# Patient Record
Sex: Male | Born: 1950 | Race: White | Hispanic: No | Smoking: Former smoker
Health system: Southern US, Community
[De-identification: ages and names within clinical notes are randomized; demographics above are authoritative.]

## PROBLEM LIST (undated history)

## (undated) DIAGNOSIS — E785 Hyperlipidemia, unspecified: Secondary | ICD-10-CM

## (undated) DIAGNOSIS — I82409 Acute embolism and thrombosis of unspecified deep veins of unspecified lower extremity: Secondary | ICD-10-CM

## (undated) DIAGNOSIS — I10 Essential (primary) hypertension: Secondary | ICD-10-CM

## (undated) DIAGNOSIS — L0390 Cellulitis, unspecified: Secondary | ICD-10-CM

## (undated) DIAGNOSIS — E669 Obesity, unspecified: Secondary | ICD-10-CM

## (undated) DIAGNOSIS — I4891 Unspecified atrial fibrillation: Secondary | ICD-10-CM

## (undated) HISTORY — PX: ABDOMINAL SURGERY: SHX537

## (undated) HISTORY — DX: Hyperlipidemia, unspecified: E78.5

## (undated) HISTORY — PX: PANNICULECTOMY: SUR1001

## (undated) HISTORY — DX: Acute embolism and thrombosis of unspecified deep veins of unspecified lower extremity: I82.409

---

## 2002-08-08 ENCOUNTER — Emergency Department (HOSPITAL_COMMUNITY): Admission: EM | Admit: 2002-08-08 | Discharge: 2002-08-09 | Payer: Self-pay | Admitting: Emergency Medicine

## 2005-07-26 ENCOUNTER — Inpatient Hospital Stay (HOSPITAL_COMMUNITY): Admission: EM | Admit: 2005-07-26 | Discharge: 2005-08-03 | Payer: Self-pay | Admitting: Emergency Medicine

## 2005-07-28 ENCOUNTER — Ambulatory Visit: Payer: Self-pay | Admitting: *Deleted

## 2005-08-05 ENCOUNTER — Inpatient Hospital Stay (HOSPITAL_COMMUNITY): Admission: EM | Admit: 2005-08-05 | Discharge: 2005-08-07 | Payer: Self-pay | Admitting: Emergency Medicine

## 2005-08-05 ENCOUNTER — Ambulatory Visit: Payer: Self-pay | Admitting: Internal Medicine

## 2005-08-12 ENCOUNTER — Emergency Department (HOSPITAL_COMMUNITY): Admission: EM | Admit: 2005-08-12 | Discharge: 2005-08-13 | Payer: Self-pay | Admitting: Emergency Medicine

## 2006-12-09 ENCOUNTER — Ambulatory Visit: Payer: Self-pay | Admitting: Infectious Disease

## 2006-12-09 ENCOUNTER — Ambulatory Visit: Payer: Self-pay | Admitting: Internal Medicine

## 2006-12-09 ENCOUNTER — Inpatient Hospital Stay (HOSPITAL_COMMUNITY): Admission: EM | Admit: 2006-12-09 | Discharge: 2006-12-14 | Payer: Self-pay | Admitting: Emergency Medicine

## 2006-12-15 ENCOUNTER — Other Ambulatory Visit: Payer: Self-pay | Admitting: Emergency Medicine

## 2006-12-15 ENCOUNTER — Ambulatory Visit: Payer: Self-pay | Admitting: Cardiology

## 2006-12-15 ENCOUNTER — Inpatient Hospital Stay (HOSPITAL_COMMUNITY): Admission: AD | Admit: 2006-12-15 | Discharge: 2006-12-18 | Payer: Self-pay | Admitting: Cardiology

## 2006-12-18 ENCOUNTER — Encounter: Payer: Self-pay | Admitting: Cardiology

## 2007-02-14 ENCOUNTER — Observation Stay (HOSPITAL_COMMUNITY): Admission: EM | Admit: 2007-02-14 | Discharge: 2007-02-15 | Payer: Self-pay | Admitting: Emergency Medicine

## 2007-03-26 IMAGING — CR DG CHEST 1V PORT
1 series · 1 of 1 positions shown · non-contrast
Comparison: none

CLINICAL DATA: Scrotum cellulitis.  PICC line placement.
 PORTABLE CHEST - 1 VIEW - 08/06/05 AT 9394 HOURS:

[view not recorded]
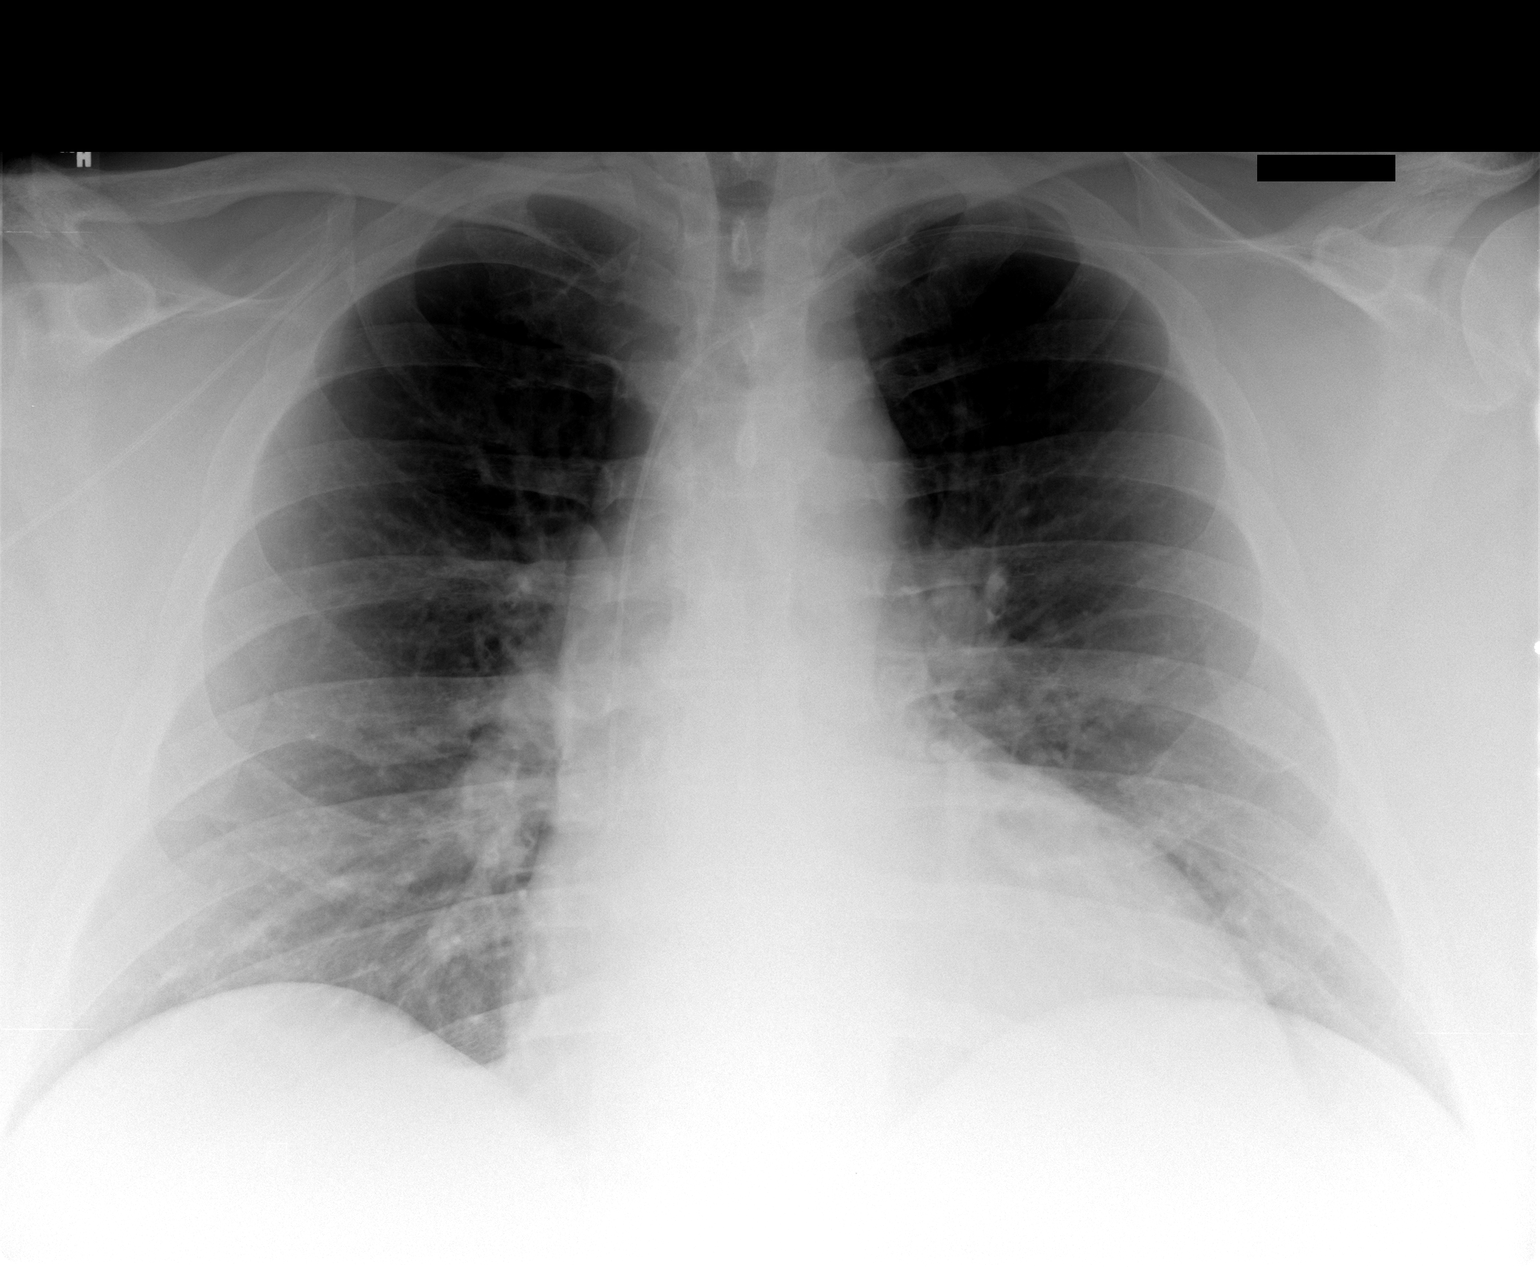

[1 of 1 positions shown; findings below may reference images not displayed]

FINDINGS: A PICC line has been inserted via a left upper extremity approach.  The tip of the catheter appears to be near the SVC-right atrial junction.  The lungs are well expanded and clear of an active process.  The heart appears mildly enlarged.
IMPRESSION: The PICC line tip is near the SVC-right atrial junction.  See comments above.

## 2008-03-20 ENCOUNTER — Encounter: Admission: RE | Admit: 2008-03-20 | Discharge: 2008-03-20 | Payer: Self-pay | Admitting: Family Medicine

## 2008-03-22 ENCOUNTER — Encounter: Admission: RE | Admit: 2008-03-22 | Discharge: 2008-03-22 | Payer: Self-pay | Admitting: Family Medicine

## 2009-03-02 ENCOUNTER — Ambulatory Visit (HOSPITAL_COMMUNITY): Admission: RE | Admit: 2009-03-02 | Discharge: 2009-03-02 | Payer: Self-pay | Admitting: Family Medicine

## 2010-05-17 NOTE — H&P (Signed)
NAMEEUSEVIO, SCHRIVER NO.:  [id number]   MEDICAL RECORD NO.:  [id number]          PATIENT TYPE:  INP   LOCATION:  2021                         FACILITY:  [facility]   PHYSICIAN:  Rollene Rotunda, MD, FACCDATE OF BIRTH:  03/16/1950   DATE OF ADMISSION:  12/15/2006  DATE OF DISCHARGE:                              HISTORY & PHYSICAL   CARDIOLOGIST:  He had previously been seen by Dr. Dorethea Clan.   PRIMARY CARE PHYSICIAN:  [name] of the teaching service.   CHIEF COMPLAINT:  Weakness and lethargy.   HISTORY OF PRESENT ILLNESS:  Mr. Hacker is a 60 year old male patient  who has a history of atrial fibrillation evaluated by Dr. Dorethea Clan some  years ago at [facility].  The patient had felt to be in  permanent atrial fibrillation at that time.  He was not felt to be a  great candidate for Coumadin secondary to a history of polysubstance  abuse.  We have seen him since that time.  He was admitted to [facility] recently with pannus cellulitis, probable septum sepsis and  acute renal failure that was mild.  He was treated with antibiotics and  his rate was controlled with medication.  He was admitted from December  7 through December 12 and discharged yesterday.  There is some report  that he left against medical advice but this is not clear from the  chart. He was to follow-up with [name] of the teaching service. The  plan was to obtain a surgical consult in the future for panniculectomy.   The patient apparently returned home yesterday and fell lethargic today  and apparently fell.  There was no reported injury.  He was seen by the  ER physician at [facility].  He was noted to be in atrial  fibrillation with rapid ventricular rate. The hospitalist service there  would not  admit him secondary to no cardiology coverage over the  weekend.  Therefore he was transferred to our service at [facility] for further evaluation and treat.   The patient denies any chest pain or significant shortness of breath.  He denies orthopnea or PND.  He says he is usually quite active and can  ambulate around the house, go up and down steps and take care of  himself.  He again denies any chest pain. He denies any syncope or near  syncope.  He denies any significant cough.   PAST MEDICAL HISTORY:  1. Permanent atrial fibrillation.      a.     No history of Coumadin therapy - he was not felt to be a       good Coumadin candidate secondary to substance abuse.  2. Hypertension.  3. Hyperlipidemia -?  4. Morbid obesity.  5. History of right knee and left knee arthroscopy.  6. History of scrotal cellulitis.   MEDICATIONS AT HOME:  According to records were to include:  1. Bactrim DS 2 tablets 3 times a day for 15 days.  2. Prevacid 30 mg daily.  3. Aspirin 81 mg daily.  4.  MiraLax.  5. Calcium carbonate plus vitamin D 3 times a day.  6. Metoprolol 100 mg b.i.d.  The patient says that he was not taking any medications at home.   ALLERGIES:  VANCOMYCIN causes a rash.   SOCIAL HISTORY:  The patient lives in [place], he is unmarried, he  has two children.  He still smokes cigarettes at a 60 pack-year history.  He drinks a six-pack of beer a week and he admits to cocaine abuse about  2 weeks ago.   FAMILY HISTORY:  Insignificant for coronary artery disease.   REVIEW OF SYSTEMS:  Please see HPI.  Denies any fevers, chills, sore  throats, melena, hematochezia, hematuria, dysuria.  He does admit to  some dysphagia but no odynophagia.  Denies any monocular blindness,  unilateral weakness, difficulty of speech or facial droop.  The rest of  the review of systems are negative.   PHYSICAL EXAM:  He is a well-nourished, well-developed, obese male in no  acute distress. He falls asleep several times during the interview.  Temperature at [facility] was 100.1, it is now 98.3, pulse 108,  respirations 20, blood pressure 136/114. Oxygen  saturation 90%  on room  air.  HEENT:  Normal.  NECK:  Without appreciable JVD although I cannot fully assess this  secondary to neck girth and beard growth.  ENDOCRINE:  Without thyromegaly.  LYMPH:  Without lymphadenopathy.  CARDIAC:  Normal S1, S2. Irregularly regular rhythm, no appreciable  murmurs.  LUNGS:  With no appreciable rales, expiratory wheezes throughout.  SKIN:  With diffuse erythema and fibrosis over his pannus.  ABDOMEN:  Nontender.  Normoactive bowel sounds.  No rebound, no  guarding.  No organomegaly.  EXTREMITIES:  With 1 to 2+ edema bilaterally.  VASCULAR:  Dorsalis pedis and posterior tibialis pulses 2+ bilaterally.  MUSCULOSKELETAL:  No joint deformity.  NEUROLOGIC:  He is alert and oriented x3.  Cranial nerves II-XII grossly  intact.   EKG reveals atrial fibrillation with a heart rate of 137, normal axis,  no ischemic changes. Poor R wave progression.   LABORATORY DATA:  Hemoglobin 12.4, hematocrit 36.5, platelet count  485,000. White count 14,200, sodium 136, potassium 3.9, BUN 13,  creatinine 0.92, glucose 146, calcium 7.5.  His previous total protein  and albumin has been significantly depressed.   IMPRESSION:  1. Atrial fibrillation with rapid ventricular rate.  2. Morbid obesity.  3. Recent history of panus cellulitis with probable sepsis.  4. Lethargy.  5. Hypertension.  6. Tobacco abuse.  7. Polysubstance abuse.  8. History of sacral decubitus.   PLAN:  The patient is also to be examined by Dr. Antoine Poche.  It is  doubtful all of his symptoms are related to atrial fibrillation.  It  sounded as though his atrial fibrillation was fairly well-controlled  prior to discharge although on review of his records his rate was still  in the 100s to 115 range at the time of discharge on metoprolol.  He is  on a diltiazem drip now and his rate is better. We would not recommend  Lopressor at this time given his history of cocaine abuse. We will check  a  urine drug screen.  In the past, he has not been felt to be a good  candidate for Coumadin secondary to polysubstance abuse.  His aspirin  will be continued and he will be covered with Lovenox while in the  hospital.  His Bactrim will be continued for now.  We can certainly  consider digoxin if his rate becomes difficult to control with the  diltiazem.  It is uncertain why he is lethargic.  We will check an ABG  on room air to rule out CO2 narcosis.  Will also has wound care to see  the patient for care of his pannus and sacral decubitus.  Will also ask  social work to become involved to help with care post discharge as well  as with medications, etc.  We will also ask the teaching service to  become involved again and assume his care while he is here.  We will  follow along to help with control of his rate and other cardiac issues.  An echocardiogram we will be checked.      Tereso Newcomer, PA-C      Rollene Rotunda, MD, [facility]  Electronically Signed    SW/MEDQ  D:  12/15/2006  T:  12/17/2006  Job:  [id number]   cc:   [name]

## 2010-05-17 NOTE — H&P (Signed)
NAMEPAGE, LANCON NO.:  [id number]   MEDICAL RECORD NO.:  [id number]          PATIENT TYPE:  OBV   LOCATION:  A313                          FACILITY:  [facility]   PHYSICIAN:  Dalia Heading, M.D.  DATE OF BIRTH:  July 22, 1950   DATE OF ADMISSION:  02/13/2007  DATE OF DISCHARGE:  LH                              HISTORY & PHYSICAL   CHIEF COMPLAINT:  Abscess, pannus.   HISTORY OF PRESENT ILLNESS:  The patient is a 60 year old morbidly obese  white male who presents with a bleeding wound on the left side of his  massive pannus.  He started having a bleeding wound and presented to the  emergency room for evaluation and treatment.  I was called as the  bleeding seemed to continue.  On evaluation, the patient had old blood  in the cavity.  This was fully evacuated along with clots.  This seemed  to quell be bleeding.  Due to the patient's history of MRSA as well as  his morbid obesity, the patient is being brought into the hospital for  wound care.   PAST MEDICAL HISTORY:  1. Morbid obesity, 460 pounds.  2. Hypertension.   PAST SURGICAL HISTORY:  Unremarkable.   CURRENT MEDICATIONS:  1. Benicar 20 mg p.o. daily.  2. Aspirin 1 tablet daily.   ALLERGIES:  No known drug allergies.   REVIEW OF SYSTEMS:  The patient denies any other cardiopulmonary  difficulties.  He is being evaluated at [facility] for possible  panniculectomy in the future.   PHYSICAL EXAMINATION:  The patient is a morbidly obese white male in no  acute distress.  LUNGS:  Clear to auscultation with equal breath sounds bilaterally.  HEART:  A regular rate and rhythm without S3, S4, or murmurs.  The  patient has a large abdomen with a lymphedematous pannus in the left  lower aspect.  A previously-evacuated abscess/hematoma cavity is not  actively bleeding at this time.  A pressure dressing has been applied.   White blood cell count 9.2, hematocrit 32, platelet count 315.  MET-7 is  within normal  limits.   IMPRESSION:  1. Cellulitis with old abscess cavity and hematoma, pannus.  2. Morbid obesity.  3. Hypertension.   PLAN:  The patient will be admitted to the hospital for wound  observation.  A pressure dressing as well as a weighted bag will be  applied to the region.  He will be started on vancomycin given his  history of MRSA.      Dalia Heading, M.D.  Electronically Signed     MAJ/MEDQ  D:  02/14/2007  T:  02/14/2007  Job:  [id number]   cc:   [facility]

## 2010-05-17 NOTE — Consult Note (Signed)
[name], Mathews NO.:  [id number]   MEDICAL RECORD NO.:  [id number]          PATIENT TYPE:  INP   LOCATION:  2021                         FACILITY:  [facility]   PHYSICIAN:  Velora Heckler, MD      DATE OF BIRTH:  03-Oct-1950   DATE OF CONSULTATION:  12/17/2006  DATE OF DISCHARGE:                                 CONSULTATION   REASON FOR CONSULTATION:  Recurrent pannus cellulitis.   HISTORY OF PRESENT ILLNESS:  [name] is a 56-year male patient,  morbidly obese, also history of atrial fibrillation on Coumadin, sleep  apnea, hypertension and prior of scrotal cellulitis requiring operative  I&D in July 2007.  He was recently admitted to [facility] from  December 7, to December 12, because of pannus cellulitis.  He was  treated with antibiotic therapy/Bactrim and was discharged home on  Bactrim therapy.  He returned on December 13, complaining of lethargy,  weakness and anorexia.  According to the documentation, the patient was  not taking any of his prescribed medications including his Bactrim.  He  presented with complaints of weakness as noted as well as increasing  pain in the pannus region.  His initial white count was 14,200.  Since  admission, the patient had been placed on doxycycline, he is allergic to  vancomycin and he has subsequently been changed over to Bactrim therapy  today.  His cultures from the wound drainage were positive for  Streptococcus agalactia, Group B pansensitive.  Internal medicine  requested surgical evaluation for panniculectomy.   REVIEW OF SYSTEMS:  As above.  The patient states his pannus is usually  somewhat ruddy discolored with chronic pitting edema and dry skin.  He  states the pain is actually markedly improved as prior to admission.   PAST MEDICAL HISTORY:  1. Atrial fibrillation, not on Coumadin.  2. Hypertension.  3. Dyslipidemia.  4. Morbid obesity.  5. Ongoing tobacco abuse.  6. OSA/possible COPD.   PAST SURGICAL HISTORY:  1. Scrotal cellulitis in July 2007, requiring intraoperative I&D by      Dr. Dennie Maizes.  2. History of bilateral knee arthroscopy in the past.   SOCIAL HISTORY:  The patient lives in [place] and he is married with  two children.  He continues to smoke 60 pack years.  He drinks a six-  pack every week.  He is disabled.  He used cocaine 2 weeks ago.  Urine  drug screen is positive for cocaine.  Positive for opiates, but the  urine drug screen was obtained after the patient had been in the ER and  had received narcotic pain medications.   ALLERGIES:  VANCOMYCIN which causes a rash.   CURRENT MEDICATIONS:  1. Bactrim.  2. Calcium.  3. Lovenox for DVT prophylaxis.  4. Full-dose aspirin.  5. Lopressor.  6. Protonix.  7. Diltiazem.  8. Thiamine.  9. Folic acid.  10.MiraLax p.r.n.  11.Morphine p.r.n. for pain.   PHYSICAL EXAMINATION:  GENERAL:  A drowsy, morbidly obese, pale patient  with only minimal complaints of pain.  VITAL SIGNS:  Temperature 97.8,  BP 144/91, pulse 108 and slightly  irregular, respirations 20.  NEUROLOGIC:  The patient awakens.  He is moving all extremities x4.  He  is somewhat drowsy and speech is slightly slurred, but movement and  otherwise physical exam is symmetrical.  HEENT:  Head is normocephalic.  Sclera noninjected.  NECK:  Supple and obese.  CHEST:  Bilateral lung sounds are clear to auscultation.  Respiratory  effort is unlabored.  The patient is supine.  CARDIAC:  Pulse is slightly irregular, S1, S2.  Telemetry demonstrates  atrial fibrillation with occasional aberrancy.  ABDOMEN:  Obese, very large.  He has a very large dependent pannus with  1+ pitting edema over the most distal portion.  There is a ruddy  discoloration to the skin of the distal pannus, that is not warm to  touch.  He has grape-like clusters of the skin involving the distal  pannus.  These are not vesicles, they are soft tissue/skin lesions.   The  remainder of his abdomen scan is normal in appearance, soft, bowel  sounds are present.  He has a large umbilical defect about 3 cm in  diameter without evidence of a herniation.  The patient was examined  supine.  EXTREMITIES:  Dependent edema of the legs with evidence of prior edema  as evidenced with hemosiderin changes to the skin.   LABORATORY DATA AND X-RAY FINDINGS:  White count 9600, hemoglobin 12,  platelets 447,000.  White count admission on 11,700.  Sodium 136,  potassium 3.7, CO2 23, glucose 117, BUN 8, creatinine 0.62.   Hip x-ray was done on admission because the patient apparently has  fallen at home.  No definite fracture.   IMPRESSION:  1. Pannus cellulitis, improving.  2. Medical noncompliance.  3. Other medical issues as previously mentioned.   RECOMMENDATIONS:  1. At this time no immediate indications for surgery.  He seems to be      improving quite well on oral antibiotics.  2. Would check an ultrasound of the pannus region to see if there is      an underlying abscess, doubt.  3. Recommend medical therapy.  The patient does have a pansensitive      Streptococcus agalactiae.  Suspect noncompliance is a contributing      factor to ongoing infection.  4. Consider ID consult regarding duration of treatment, especially      since this is involving a morbidly person with a pannus that may      have limited perfusion.  He may benefit from IV antibiotic      treatment for this and may need a PICC line, but I will defer this      to internal medicine/ID.  5. Regarding panniculectomy, would not do that in a patient that has      an acute infection.  A pannus in general, even when healthy, are      notorious for poor healing.  If an abscess were to be detected, we      would potentially drain from a percutaneous standpoint, if      possible, or drain with a smallest incision possible to help      promote better healing.  If extensive pannus surgery is  indicated      in the future over noninfected skin, would recommend plastics      consultation or referral.   TM[facility] - Patient previously seen for this problem at [facility].  No indication for acute surgical intervention  at present.  No  plastic surgery services available for this patient in [place].  Would treat as above and refer to [facility] for follow-up.      Allison L. Rennis Harding, N.P.      Velora Heckler, MD  Electronically Signed    ALE/MEDQ  D:  12/17/2006  T:  12/18/2006  Job:  [id number]

## 2010-05-17 NOTE — Discharge Summary (Signed)
[name], CLAIRE NO.:  [id number]   MEDICAL RECORD NO.:  [id number]          PATIENT TYPE:  INP   LOCATION:  2021                         FACILITY:  [facility]   PHYSICIAN:  Alvester Morin, M.D.  DATE OF BIRTH:  11-28-1950   DATE OF ADMISSION:  12/15/2006  DATE OF DISCHARGE:  12/18/2006                               DISCHARGE SUMMARY   DISCHARGE DIAGNOSES:  1. Chronic atrial fibrillation with rapid ventricular response.  2. Right hip pain.  3. Metabolic acidosis.  4. Hypertension.  5. Right hip pain.  6. Sacral skin tear.  7. Hypertension.   DISCHARGE MEDICATIONS:  1. Bactrim DS two tablets b.i.d. for 21 more days.  2. Prevacid 30 mg daily.  3. Aspirin 81 mg daily.  4. MiraLax p.r.n.  5. Metoprolol 100 mg p.o. b.i.d.  6. Diltiazem 240 mg p.o. daily.  7. Calcium carbonate with vitamin D 500 mg p.o. daily.   DISPOSITION:  Patient will follow up with his primary care physician as  an outpatient.  Patient will also follow up with [facility] for the  possibility of surgery for a pannectomy.   PROCEDURE PERFORMED:  None.   CONSULTATIONS:  None.   BRIEF ADMISSION HISTORY AND PHYSICAL:  This is a 60 year old male with  past medical history of morbid obesity, hypertension, atrial  fibrillation with RVR, just discharged from this hospital for pannus  cellulitis presents to the ED because of a history of falling.  Patient  returned home to live wit his mom the day before admission.  The patient  slipped and fell while moving from his bed to his living room.  He  denies any presyncope, any imbalance, vertigo, shortness of breath,  prior history of clotting.  He reports he did not fall, he just slipped.  He denied any focal weakness.  The patient reports that before his  previous admission, he had had some shortness of breath.  Patient  reported that his diarrhea which developed during the recent  hospitalization has resolved.  Patient reported pain under his  pannus on  the side of infection. Noted to have afib with RVR in ED and was  admitted for this.   PHYSICAL EXAMINATION:  VITAL SIGNS:  His temperature was 97.3, his blood  pressure was 108/18, respirations 18, pulse was 143.  GENERAL APPEARANCE:  He is a morbidly obese male lying in bed.  LUNGS:  Clear to auscultation without airway wheezing.  CARDIOVASCULAR:  Irregular with an S1 and S2.  ABDOMEN:  Positive bowel sounds.  Tender in the area of the pannus.  EXTREMITIES:  Lower extremity cold to touch but positive pulses with 2+  pitting edema.  SKIN:  Pannus with erythema and drainage, lichenification of distal part  of pannus; improved from previous admission.  NEUROLOGIC:  Nonfocal.   LABORATORY DATA:  Sodium 136, potassium 3.9, chloride 107, bicarb 18,  BUN 13, creatinine 0.9, glucose 146.  White blood cells 14.6, ANC 12.7,  hemoglobin 12.4, MCV 87.8, platelets 487.   HOSPITAL COURSE:  PROBLEM #1 -  CHRONIC ATRIAL FIBRILLATION WITH RAPID  VENTRICULAR RESPONSE:  Patient was discharged from the hospital on  metoprolol 100mg  bid.  However, he was not taking his medication.  He  was started him on IV Diltiazem and p.o. Lopressor and controlled his  heart rate.  We checked a UDS which was negative.  We  changed him to  p.o. Diltiazem and Lopressor and his heart rate is controlled right now.  His heart rate is 76 on Diltiazem 240 mg qd and metoprolol 100 b.i.d.   PROBLEM #2 -  PANNUS CELLULITIS WITH LEUKOCYTOSIS:  Patient reports the  pain is the same.  He did not take his Bactrim when he was at home.  This was restarted and his white count came down.  No changes were made  to antibiotic.  He should be taking his antibiotic for at least 21 more  days.  He will be followed up at [facility] for evaluation for a  pannectomy.   PROBLEM #3 -  METABOLIC ACIDOSIS:  His anion gap was 15 and bicarb 18 on  admission.  The etiology of this was not clear and it quickly resolved.  His lactic  acid was negative.  PROBLEM #4 -  SACRAL SKIN TEAR:  Wound care was consulted.  Patient is  currently on antibiotic for his pannus cellulitis.   PROBLEM #5 -  HYPERTENSION:  We restarted his Lopressor and Diltiazem  and that helped with his blood pressure control.  On the day of  discharge, his blood pressure was 123/59.   PROBLEM #6 -  RIGHT HIP PAIN:  The patient related that when he slipped,  he hit his right hip.  A chest x-ray of the hip was done and it showed  no fractures.   DISCHARGE LABORATORY DATA:  Sodium 141, potassium 3.7, chloride 109,  bicarb 25, glucose 118, BUN 8, creatinine 0.7, calcium 8.1.  White blood  cells 8.6, hemoglobin 11.1, platelets 245.   DISCHARGE VITAL SIGNS:  Temperature 97, pulse 76, blood pressure 123/59.  His oxygen saturation was 92% on room air.      Marinda Elk, M.D.  Electronically Signed      Alvester Morin, M.D.  Electronically Signed    AF/MEDQ  D:  12/18/2006  T:  12/18/2006  Job:  [id number]

## 2010-05-17 NOTE — Discharge Summary (Signed)
[name], [name] NO.:  [id number]   MEDICAL RECORD NO.:  [id number]          PATIENT TYPE:  INP   LOCATION:  4740                         FACILITY:  [facility]   PHYSICIAN:  Alvester Morin, M.D.  DATE OF BIRTH:  [birth year]   DATE OF ADMISSION:  12/09/2006  DATE OF DISCHARGE:  12/14/2006                               DISCHARGE SUMMARY   CHIEF COMPLAINT:  Pannus cellulitis.   DISCHARGE DIAGNOSES:  1. Cellulitis of the pannus.  2. Diarrhea, resolved.  3. Atrial fibrillation with rapid ventricular response.  4. Sacral tear.  5. Hypotension, resolved.  6. Hypocalcemia, resolved.  7. Metabolic acidosis, resolved.  8. Hyponatremia.  9. Hypokalemia.  10.Acute renal insufficiency.   DISCHARGE MEDICATIONS:  1. Bactrim double strength 2 tabs b.i.d. x15 days.  2. Prevacid 30 mg daily.  3. Aspirin 81 mg daily.  4. Miralax 72 g q.h.s. p.r.n. constipation.  5. Calcium carbonate with D 500 mg t.i.d. with milk.  6. Metoprolol 100 mg twice daily.   DISCHARGE FOLLOWUP:  The patient will follow up with [name], his  primary care doctor, phone number [phone], December 22, 11 a.m.  At  this time, [name] can make sure that the cellulitis is continuing  to improve, and the patient is continuing on his antibiotics as  prescribed.  The patient will also be seen by [facility] for  home health nursing.  We will call the patient to make sure that his  cellulitis continues to improve as well as check his sacral tear.  The  patient will be discharged home with a Foley catheter, and the patient  states that the Foley catheter is often changed by home health nursing.  This will need to be changed in 30 days.  He will have the insertion  date written on the back.  The other issue to address with [name]  as an outpatient is the need for a surgical panniculectomy.  The patient  was referred to [facility], [facility] for  panniculectomy in the  past; however, this was deferred for 1 year  secondary to a history of MRSA infection.  The patient now has had a  cellulitis of his pannus which was not infected by MRSA; therefore, as  soon as this infection clears, it is time to readdress the situation.  The patient is willing to proceed with this operation at this time.   STUDIES:  None.   CULTURES:  Blood culture, December 09, 2006, negative x2; urine culture,  December 09, 2006, negative x2; wound culture, December 09, 2006, abundant  group B strep x2.  No sensitivities were performed.  C. difficile,  December 13, 2006, negative x1.   CONSULTS:  None.   BRIEF HISTORY AND PHYSICAL:  Mr. [name] is a 60 year old morbidly obese  white male with a history of hypertension, atrial fibrillation, who  presents with a pannus cellulitis.  The patient reports that 6 days  prior to admission, he felt chills and burning into his belly.  He went  to his primary care [name] 3 days prior to  admission and was started  on antibiotics.  Before visiting his PCP, he reports that the burning  and warmth on his belly had gotten worse.  The patient has a history of  MRSA cellulitis of his testicles approximately 1 year ago.  The patient  was seen at [facility] for surgical excision of the  abdominal mass/pannus.  After his history of MRSA cellulitis, and they  chose to defer the surgery for approximately 1 year until he was MRSA  infection free.  The patient reports some objective fevers prior to  admission, no shortness of breath, no nausea or vomiting.  The patient  had diarrhea 1 time about 4 days prior to admission which resolved  spontaneously.  The patient's previous cellulitis was treated with  doxycycline and now to the point that he is able to ambulate and care  for himself at home, wash himself and  up and down stairs.  The patient  was started on doxycycline and was treated for appendix 2 days prior to  hospital admission by his  primary cae doctor, [name].  THE  PATIENT HAS ALLERGY TO VANCOMYCIN WHICH CAUSES A RASH.   MEDICAL HISTORY:  1. History of scrotal cellulitis status post I&D in 207.  2. Morbid obesity, approximately 550 pounds.  3. History of atrial fibrillation with RVR, uncontrolled.  4. Hypertension.  5. History of elevated PSA.   ADMISSION MEDICATIONS:  1. Benicar.  2. Doxycycline 100 mg b.i.d. started December 4.  3. Tylenol p.r.n..   The patient is a current smoker, 1/2 pack-per-day x3 years, quit 10  years ago and restarted 3 years ago.  Apart of that had smoked for 25  years.  The patient also acknowledges drinking about a 6-pack of beer 3-  4 times a week.  He occasionally sniffs cocaine which he snorts  approximately 2 times per month and denies any other IV drug use.  He is  divorced, lives with his mother, and some family history is  noncontributory.   REVIEW OF SYSTEMS:  Positive for fever, chills, night sweats, diarrhea,  anorexia, rash, itching, and chronic joint pain.   ADMISSION VITALS:  Temperature 100.1, blood pressure 93/46, pulse 141,  respiratory rate 32, saturating 97% on room air.  GENERAL:  The patient is in no acute distress, who appeared much better  than his vitals indicate.  Thus he was laid sideways in bed with his leg  over the rail watching football.  He had nonicteric pupils, small but reactive.  Extraocular movements  were intact.  ENT:  No erythema or exudate.  RESPIRATORY:  Clear anteriorly.  CARDIOVASCULAR:  Irregularly irregular but very distant.  GI:  Large pannus with a level of dark tint approximately 3-4 inches  below his navel.  This area is warm in the distal pannus.  There is  approximately 2+ edema on the tip of the pannus.  It is very firm, and  there was an oozing, clear/white puss.  There are deep bridges down at  the bottom of the pannus which appear almost to be like crypts.  Otherwise, above the area of infection, the belly is soft,  nontender, no  distention.  The tip of the pannus has no sensation.  GU:  Foley is in place.  SKIN:  See above with no LAD.  MUSCULOSKELETAL:  Normal strength and tone in both the upper and lower  extremities.  Cranial nerves 2-12 are intact, and sensation is intact except on the  pannus.  No other focal deficits.  PSYCH:  Appropriate.   LABS:  CBC shows a white count of 16.2, hemoglobin 13.4, platelets 288  with an ANC of 14.6, PT 15.1, INR 1.2, PTT 38.  Electrolytes:  Sodium  130,  potassium 3.3, chloride 98, bicarb 19, BUN 22, creatinine 1.47,  glucose 121.  The patient had a baseline creatinine of 1.1 on August 03, 2005.  EKG was done which showed atrial fibrillation.  Enzymes were  done.  Bilirubin 1.6, alk phos 59, AST 42, ALT 19, protein 6.1, albumin  2.0, calcium 8.2.   HOSPITAL COURSE BY PROBLEM:  1. Pannus cellulitis.  The patient presents with a rather large      infection of his distal pannus.  It is warm, red, edematous, oozing      a large amount of discharge.  The area of infection is      approximately 2 feet wide x 2 feet in diameter.  The patient has      history of scrotal cellulitis in October 2007, which was caused by      MRSA and initially it was thought that this was a recurrence.  The      patient was febrile, had a high white count, and hypotensive      systolic pressure in the 90s.  The patient was also tachycardic,      and we felt that he was possibly a set up for becoming septic.      Therefore, the patient was initially started on doxycycline and      Zosyn and placed on the ICU for close monitoring.  Multiple fluid      boluses were given to the patient to try and raise his blood      pressure, and he had a small response, maintaining his blood      pressure in the systolic in the 90s.  Wound cultures as well as      urine and blood cultures were also obtained, both of which were      negative.  The wound cultures grew back group B strep.  After       evaluation in the ICU x3 days and very aggressive hydration with IV      fluids with multiple 1 liter boluses of saline as well as a normal      saline rate of 200-300 mL/hr, the patient's blood pressure began to      improve.  It eventually settled down at around 100-120 systolic,      although his pulse remained in the low 110s-120s and in an atrial      fibrillation rhythm.  After cultures grew back, initially the gram-      negative coverage was dropped and although the patient was started      on IV doxycycline, after initially he was completely changed over      to IV Bactrim, as we felt that this worsening infection on p.o.      doxycycline as an outpatient constituted a failure.  This IV      doxycycline was then transitioned over to p.o. Bactrim, and the      patient was continued on Bactrim throughout his hospital course.      The patient completed 6 days of p.o. Bactrim and IV Bactrim while      in the hospital and was discharged home with 15 more days of      Bactrim for a total  __________ course of 21 days.  The patient's      white count improved over the course of his hospitalization from 16      on admission down to 10.8 on the day of discharge.  The patient      also remained afebrile with temperature of 99 at discharge.  2. Hyponatremia.  Initially, the patient presented with a sodium of      130, serum osm and urine osm were both tested.  His urine osm was      99.  We felt that this hyponatremia was likely hypovolemic      hypernatremia.  Therefore, we continued to aggressively hydrate the      patient, as the patient received IV hydration, and sodium began to      improve and eventually normalized at 137 on day of discharge.  3. Hypokalemia.  Initially on presentation, the patient's potassium      was 3.0, felt that this hypokalemia was related to renal __________      .  His mag level was checked, and it was found to be 2.2.      Therefore, we were unsure because of  his hypokalemia at this time,      he __________ .  His potassium was repleted orally and improved and      remained normalized throughout the rest of his hospital course.  4. Atrial fibrillation with rapid ventricular response.  The patient's      heart remained in atrial fibrillation, irregularly irregular      throughout his hospitalization.  The patient appears to be      chronically in this rhythm, and we chose to rate control him as      soon as his blood pressure would tolerate it.  We started the      patient initially on Lopressor, initially at 25 mg, and this was      titrated up to try and improve rate control.  The patient was      discharged home on Lopressor 100 mg b.i.d., and we felt this would      help maintain the patient's rate.  The patient was tachycardic up      to the 140s initially but on the day of discharge, tachycardia had      decreased to the 110s-120s.  5. Sacral tear.  The patient presented with a small sacral tear      approximately 4 inches from the top of his gluteal folds.  This was      seen by wound care and dressed.  They continued to pack this, and      the patient will have home health nursing come and check on the      sacral tears as an outpatient.  6. Hypotension:  This resolved without giving fluids and was felt to      be secondary to dehydration.  7. Hypocalcemia.  The patient's calcium  proceeded to climb during his      hospital course.  We were unsure of the cause but because of this      hypocalcemia, his mag level was normal.  PTH and vitamin D levels      were checked.  It is likely the patient would be vitamin D      deficient, and we started the patient on 500 mg of calcium plus      vitamin D t.i.d.  On the day of discharge, the patient's calcium  had increased to 7.4 which __________ corrected.  Based on his low      albumin, was 10.3.  8. Acute renal failure.  Initially, the patient's creatinine was      elevated at 1.5 but  decreased with aggressive IV hydration.  The      patient was prerenal, and creatinine normalized as the fluid      deficit decreased.  The patient's creatinine on day of discharge      was 0.7.  The patient had no history of chronic renal insufficiency      and no reason to believe that there was an underlying cause.   DISCHARGE LABS AND VITALS:  Temperature 97.6, blood pressure 117/64,  pulse 95, respiratory rate 20, saturating 97% on room air.  CBC:  White  count 10.8, hemoglobin 12.1, platelets 427.  Electrolytes:  Sodium 137,  potassium 3.8, chloride 110, bicarb 23, __________ 10, creatinine 0.83,  glucose 126.      Tacey Ruiz, MD  Electronically Signed      Alvester Morin, M.D.  Electronically Signed    JP/MEDQ  D:  12/14/2006  T:  12/15/2006  Job:  [id number]   cc:   Corrie Mckusick, M.D.

## 2010-05-20 NOTE — Group Therapy Note (Signed)
NAMEGARALD, RHEW              ACCOUNT NO.:  [id number]   MEDICAL RECORD NO.:  [id number]          PATIENT TYPE:  INP   LOCATION:  A220                          FACILITY:  [facility]   PHYSICIAN:  Margaretmary Dys, M.D.DATE OF BIRTH:  [birth year]   DATE OF PROCEDURE:  08/02/2005  DATE OF DISCHARGE:                                   PROGRESS NOTE   SUBJECTIVE:  Patient continues to do fairly well.  He feels a little more  comfortable.  He denies any fevers or chills, looking forward to going home.   PHYSICAL EXAMINATION:  His physical examination is unchanged.   LABORATORY DATA:  Unchanged.  The patient does have MRSA growing from the  scrotal cellulitis drainage.   ASSESSMENT AND PLAN:  1.  Scrotal cellulitis.  The patient has MRSA, remains on intravenous      vancomycin.  The patient has a PICC line already.  We will plan for home      IV antibiotics.  2.  Atrial fibrillation, rate controlled.  We will consider decreasing his      rate.  3.  Morbid obesity.  Patient needs referral for panniculectomy as an      outpatient.      Margaretmary Dys, M.D.  Electronically Signed     AM/MEDQ  D:  08/02/2005  T:  08/02/2005  Job:  [id number]

## 2010-05-20 NOTE — Op Note (Signed)
NAMEKRISTINE, TILEY              ACCOUNT NO.:  [id number]   MEDICAL RECORD NO.:  [id number]          PATIENT TYPE:  INP   LOCATION:  A220                          FACILITY:  [facility]   PHYSICIAN:  Dennie Maizes, M.D.   DATE OF BIRTH:  [birth year]   DATE OF PROCEDURE:  07/29/2005  DATE OF DISCHARGE:                                 OPERATIVE REPORT   PREOPERATIVE DIAGNOSIS:  Scrotal abscess.   POSTOPERATIVE DIAGNOSIS:  Scrotal abscess.   OPERATIVE PROCEDURE:  Incision and drainage of scrotal abscess.   ANESTHESIA:  Monitored anesthesia care.   SURGEON:  Dennie Maizes, M.D.   COMPLICATIONS:  None.   DRAINS:  None.  The wound was packed.   SPECIMEN:  Pus from scrotal abscess for aerobic and anaerobic culture   COMPLICATIONS:  None.   INDICATIONS FOR PROCEDURE:  This 60 year old male with a scrotal abscess was  taken to the operating room today for incision and drainage of the abscess.   DESCRIPTION OF PROCEDURE:  The patient was placed on the table in the supine  position.  IV sedation was given.  The genitalia were prepped and draped in  a sterile fashion.  There was an abscess in the base of the left  hemiscrotum.  There was drainage of pus from the abscess.  An incision about  6 cm in length was made over the most fluctuant part of the abscess.  About  50 cc of pus was drained.  Pus culture was done for aerobic and anaerobic  organisms.  Distal exploration of the abscess cavity was done, and all the  floccules were broken.  The bleeding vessels were cauterized and complete  hemostasis was  obtained.  The abscess cavity was then irrigated with dilute  Betadine solution.  The abscess cavity was then packed with gauze soaked in  dilute Betadine.  A dressing was applied.  The estimated blood loss was  minimal.  The patient was transferred to the PACU in a satisfactory  condition.      Dennie Maizes, M.D.  Electronically Signed     SK/MEDQ  D:  07/29/2005  T:   07/30/2005  Job:  [id number]

## 2010-05-20 NOTE — Group Therapy Note (Signed)
[name], [name]              ACCOUNT NO.:  [id number]   MEDICAL RECORD NO.:  [id number]          PATIENT TYPE:  INP   LOCATION:  A220                          FACILITY:  [facility]   PHYSICIAN:  Margaretmary Dys, M.D.DATE OF BIRTH:  08-19-1950   DATE OF PROCEDURE:  07/30/2005  DATE OF DISCHARGE:                                   PROGRESS NOTE   SUBJECTIVE:  The patient is seen now and is fairly drowsy when I saw him.  He said his pain was better.  He feels a little comfortable.  The patient is  status post I&D by Dr. Rito Ehrlich yesterday.  He denies any fevers or chills.   OBJECTIVE:  GENERAL:  Morbidly obese, not in acute distress.  The patient  was fairly drowsy, although easily arousable.  VITAL SIGNS:  Blood pressure was 137/82, pulse of 81, respirations 25,  temperature 97.7.  Oxygen saturation was 96% on room air.  HEENT:  Normocephalic and atraumatic.  Oral mucosa was moist with no  exudates.  NECK:  Supple.  No JVD, no lymphadenopathy.  LUNGS:  Reduced air entry bilaterally.  ABDOMEN:  Grossly obese.  GU:  Status post drainage of the left scrotal abscess.   LABORATORY/DIAGNOSTIC DATA:  White blood cell count has improved today to  7.7.  Hemoglobin is 14.1, hematocrit of 39.8, platelet count was 221 with no  left shift.  Sodium 134, potassium 3.7, chloride of 103, CO2 25, glucose  139.  Calcium was 7.5.  Wound cultures are growing from the 25th of July  MRSA.   ASSESSMENT/PLAN:  1.  Scrotal cellulitis:  The patient is status post incision and drainage by      Dr. Rito Ehrlich.  Continue current intravenous antibiotic therapy.  The      patient has methicillin-resistant Staphylococcus aureus in his wounds.  2.  Atrial fibrillation:  Rate is controlled.  The patient is not an      anticoagulation candidate.  We will continue his Cardizem which appears      to be controlling his weight very well.  3.  Large  panniculus in the lower abdomen:  The patient will need to refer  to a specialist for further evaluation.  Overall the patient is stable,      although really drowsy.  I will return to see him during the day.      Margaretmary Dys, M.D.  Electronically Signed     AM/MEDQ  D:  07/30/2005  T:  07/30/2005  Job:  [id number]

## 2010-05-20 NOTE — Consult Note (Signed)
[name], Randall NO.:  Mathews   MEDICAL RECORD NO.:  [id number]          PATIENT TYPE:  INP   LOCATION:  A220                          FACILITY:  [facility]   PHYSICIAN:  Vida Roller, M.D.   DATE OF BIRTH:  1950/08/04   DATE OF CONSULTATION:  07/28/2005  DATE OF DISCHARGE:                                   CONSULTATION   PRIMARY CARE PHYSICIAN:  None.   REFERRING PHYSICIAN:  Dennie Maizes, M.D., [facility], [facility]   HISTORY OF PRESENT ILLNESS:  [name] is a 60 year old man who has a  history of morbid obesity, weighing over 500 pounds.  He was admitted on  July 26, 2005, for a scrotal abscess.  This was drained under the care of  Dr. Dennie Maizes at the urology service here.  He was found to be in  atrial fibrillation with rapid ventricular response on an EKG, and we were  asked to evaluate him.  He is asymptomatic.  He denies any chest pain or  shortness of breath.  He is relatively limited due to his massive size and  his scrotal abscess but does not report any PND or orthopnea.  His lower  extremities have always been quite large.  He does not recall any edema.  His pain is primarily in the scrotal region.   MEDICATIONS:  He does not take any medications at home.   ALLERGIES:  He is not allergic to any medications.   PAST MEDICAL HISTORY:  He does not have any significant past medical  history.   PAST SURGICAL HISTORY:  He has had surgical procedure on his legs in the  past for some knee problems but does not remember what they were.   SOCIAL HISTORY:  He lives in [place].  He does not work.  He smokes about  1/2 pack of cigarettes a day and has done that for the last 10 years  although he says that he quit smoking for a long period of time.  He drinks  as much as a case of beer a day although typically only 2-3 beers a day.  The last drink was on Sunday.  He uses cocaine on a regular basis.  The last  use was about 3 weeks  ago according to him.   FAMILY HISTORY:  His family history is significant for blood clots in  multiple family members.  He denies any other medical problems.   REVIEW OF SYSTEMS:  His review of systems is generally negative.   PHYSICAL EXAMINATION:  GENERAL:  He is a morbidly obese man lying in bed  without any significant complaints.  VITAL SIGNS:  His pulse is 106.  He is  afebrile at 99.  His respirations are 20.  His blood pressure is 120/68, and  he is saturating 98% on room air.  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  NECK:  His neck is quite full.  I do not appreciate any jugular venous  distention or carotid  bruits.  CHEST:  His chest is quite distant but there is reasonable air movement.  CARDIOVASCULAR:  Exam is very distant.  No murmurs appreciated.  ABDOMEN:  Morbidly obese.  No obvious hepatosplenomegaly.  EXTREMITIES:  The lower extremities are without significant edema although  they are quite obese.  Pulses are 1+.   CURRENT MEDICATIONS:  1.  Cardizem CD 120 mg once a day.  2.  Lovenox as DVT prophylaxis dose.  3.  Gentamicin and vancomycin IV.  4.  He is on a nicotine patch.  5.  He is on Protonix 40 mg once a day.  6.  He is occasional p.r.n. and he has had one dose of Dilaudid for pain.   LABORATORY DATA:  His chest x-ray shows no acute disease.  He has had mild  cardiomegaly.  The CBC shows a white count of 11.9, H&H of 14 and 39,  platelet count 157,000.  Sodium 134, potassium 3.7, chloride 104,  bicarbonate 28, BUN 11, creatinine 0.8, blood sugar 125.  LFTs are normal.  TSH is 0.768 which is normal.  Urine drug screen is positive for cocaine and  his urinalysis is normal.   ASSESSMENT:  1.  Rapid fibrillation with rapid ventricular response in a patient who is      asymptomatic, completely unaware that he is in atrial fibrillation and      it is uncertain  how long this has occurred.  He is not having any chest      pain.  He is currently on p.o. diltiazem with a poorly controlled heart      rate.  2.  Morbid obesity in excess of 500 pounds.  3.  Scrotal abscess on IV antibiotics.  4.  Polysubstance abuse with urine drug screen positive for cocaine.  5.  Active tobacco abuse.   RECOMMENDATIONS:  1.  Rate control with diltiazem.  I would probably use a short acting p.o.      diltiazem here.  You can titrate it up very easily in this guy and get      his heart rate controlled.  I think if his heart rate is less than 90 he      is probably doing reasonably well.  I suspect his atrial fibrillation if      probably chronic given his multiple medical problems.  He probably has      pretty severe chronic obstructive pulmonary disease given his tobacco      abuse and his morbid obesity.  It is very likely that he also has      central sleep apnea.  2.  He is not a candidate for anticoagulation due to his polysubstance abuse      so I probably would put him on full dose aspirin.  3.  His morbid obesity precludes any further cardiac assessment because he      is too big to fit on any of the tables.  Echocardiogram, I think, would      be unproductive as it is very unlikely that we would be able to      adequately evaluate the heart given his obesity.  At this point I think      what I would do is just treat him symptomatically and move forward with      hopefully with some significant lifestyle modification.      Vida Roller, M.D.  Electronically Signed     JH/MEDQ  D:  07/28/2005  T:  07/28/2005  Job:  [id number]

## 2010-05-20 NOTE — Discharge Summary (Signed)
[name], [name] NO.:  [id number]   MEDICAL RECORD NO.:  [id number]          PATIENT TYPE:  INP   LOCATION:  5713                         FACILITY:  [facility]   PHYSICIAN:  [name], M.D.     DATE OF BIRTH:  [birth year]   DATE OF ADMISSION:  08/05/2005  DATE OF DISCHARGE:  08/07/2005                                 DISCHARGE SUMMARY   ATTENDING PHYSICIAN:  Dr. Darrick Mathews.   CHIEF COMPLAINT:  Change of a PICC line.   CONTINUED ED DOCTOR:   CONSULTANTS:  There are no consultants.   DISPOSITION:  1.Patient was told to follow up his urologist, Dr. Rito Mathews,  as previously instructed, in 1 week.   DISCHARGE DIAGNOSIS:  1. 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 Allergy to Vancomycin   PAST MEDICAL HISTORY:  1. Hypertension.  2. 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 History of paroxysmal Atrial fibrillation.  4. History of polysubstance abuse, cocaine positive last admission.   DISCHARGE MEDICATIONS:  1. Diltiazem 180 mg daily  2. Aspirin 81 mg daily.  3. Doxycycline 100 mg p.o. b.i.d. for 1 week.   PROCEDURES PERFORM:  1. PICC Line placement   H&P:  The patient was admitted for a change of a nonfunctioning PICC line  for continued administration of IV vancomycin.  Patient had recently been  admitted to [facility] to the service of Dr. Rito Mathews for treatment  of a scrotal abscess.  He had received an I & D and discharged home with IV  vancomycin approximately 1 week prior to current admission.  His prevoius  physician, Dr. Rito Mathews, Urology was unavailable for admission and ER  requested [facility] to admit patient for PICC line placment.   Labs:  WBC: 5.9, H&H: 15.4/44.2, PLT's: 260, NA: 135, K: 4.6, CL, 100, CO2: 30,  BUN: 9, Cr.: 1.1, Glucose 120  Wound culture from prior hospitalization notable for MRSA sensitive to  vancomycin, gentamycin, septra and doxycycline.   HOSPITAL COURSE:  1. Scrotal Abscess with  cellulitis, s/p I & D on last hospitalization,      with wound culture that grew MRSA sensitive to doxycycline.  patient      had already recieved 1 week of vancomycin.  Antibiotic were changed to      doxycycline given a newly discovered allergy to vancomycin.  he had      been told to follow up as originally planned with his urologist, Dr.      Rito Mathews, in 1 week. patient  was also instructed to bath 1-2 x weekly      with Hiba-Clens and to switch to an antibacterial soap ( Lever 2000)      for daily hygeine.  2. Allergy secondary to Vancomycin.  After admission, patient was noticed      to have a diffuse pruritic rash, which he reported as having started      during prioir hospitalization after his antiobiotics were switched ti      Vancomycin. His rash improved with discontinuation of vanc.  3. Morbid obesity  with severe pannus,  patient had been referred to [facility] for plastic Surgery evaluation for pannectomy and      was encourage to keep that apointment.  4. Hypertension; no changes were made to his medication regimen  5. history of paroxysmal atrial fibrillation; patient remained in sinus      rhythm on diltiazem.  he is not anticoagulated per previous physician      decision due to history of cocaine abuse.   DISCHARGE LABS:  His discharge labs are hemoglobin A1c of 5.9.  Blood  cultures positive for staph, MRSA, which is sensitive to doxycycline.  His  last BMET was Na:134, potassium 3.9, chloride 100, bicarb 30, glucose 132,  BUN 9, creatinine 1.1.  His CBC was, white blood cell 6.2, H&H 14.7, 41.0,  MCV 86.7, RDW 13.4 and platelets 233.   His vitals, temperature 96.  Pulse 62.  Respirations 18.  Blood pressure  155/55.  Saturating 96% on room air.      [name], M.D.  Electronically Signed      [name], M.D.  Electronically Signed    AF/MEDQ  D:  08/07/2005  T:  08/08/2005  Job:  [id number]

## 2010-05-20 NOTE — Consult Note (Signed)
[name], BLUNCK NO.:  [id number]   MEDICAL RECORD NO.:  [id number]          PATIENT TYPE:  INP   LOCATION:  A313                          FACILITY:  [facility]   PHYSICIAN:  Dennie Maizes, M.D.   DATE OF BIRTH:  06/05/1950   DATE OF CONSULTATION:  07/26/2005  DATE OF DISCHARGE:                                   CONSULTATION   REASON FOR CONSULTATION:  Scrotal pain and swelling.   CONSULTATION REPORT:  This 60 year old male has morbid obesity and he weighs  more than 500 pounds.  He has not seen a physician for the past ten years.  He has been doing well until recently.  He started having pain and swelling  of the left scrotal area on Monday.  The pain and swelling increased in  severity and he came to the emergency room for further evaluation.  There is  no history of injury to the scrotum.  He denied having voiding difficulty,  dysuria, fever, or chills.  There is no history of urinary tract infection,  chronic prostatitis, or hematuria in the past.  He has not had any kidney  stones.  The pain was very severe in intensity 10/10.   PAST MEDICAL HISTORY:  Morbid obesity.  He has undergone surgical procedures  on the right leg as well as the left knee.   MEDICATIONS:  Tylenol p.r.n.   ALLERGIES:  None.   PHYSICAL EXAMINATION:  The patient is moderately obese and weighs more than  500 pounds.  Abdomen soft, no palpable flank mass or CVA tenderness.  A  large panniculus is noted covering the scrotum and penis.  The penis is  buried in the prepubic fat.  There is erythema of the scrotal skin.  Right  testis is normal.  There is no tenderness.  There is a small area of  ulceration over left hemiscrotal skin.  There is thickening and edema of the  scrotal skin with cellulitis.  Left testis is normal in size.  Rectal  examination could not be done due to the patient's obesity.   ADMISSION LABS:  BUN 10, creatinine of 0.8.  Electrolytes within normal  range.   Glucose 167.  CBC with WBC 11.3, hemoglobin 14.6, hematocrit 41.6.   IMPRESSION:  Scrotal cellulitis, possible insect bite.   The patient has had wound culture and sensitivity, blood culture and  sensitivity, as well as urine culture and sensitivity.  We will await the  results. He is on IV Levaquin and responding well.  We will continue the  same treatment.  Thank you for this consult.  I plan to follow the patient  with you.      Dennie Maizes, M.D.  Electronically Signed     SK/MEDQ  D:  07/26/2005  T:  07/26/2005  Job:  [id number]   cc:   Osvaldo Shipper, MD

## 2010-05-20 NOTE — Discharge Summary (Signed)
NAMEHRISHIKESH, HOEG              ACCOUNT NO.:  [id number]   MEDICAL RECORD NO.:  [id number]          PATIENT TYPE:  INP   LOCATION:  A220                          FACILITY:  [facility]   PHYSICIAN:  Margaretmary Dys, M.D.DATE OF BIRTH:  Jan 18, 1950   DATE OF ADMISSION:  07/26/2005  DATE OF DISCHARGE:  08/02/2007LH                                 DISCHARGE SUMMARY   DISCHARGE DIAGNOSES:  1. Scrotal cellulitis.  2. Morbid obesity with patient weighing over 500 pounds.  3. Lower abdominal mass with pannus.  4. Atrial fibrillation with rapid ventricular response.   DISCHARGE MEDICATIONS:  1. Aspirin 81 mg once a day.  2. Cardizem 180 mg one tablet daily.  3. Bactrim one tablet p.o. b.i.d. for 14 days.  4. Vancomycin 2000 mg IV every 12 hours.  5. Oxycodone 5 mg one tablet every 4 hours as needed.   CONSULTATIONS:  1. Dennie Maizes, MD, Urology.  2. Farris Has. Dorethea Clan, MD, for Cardiology.  The patient was in atrial      fibrillation with rapid ventricular response.   SPECIAL PROCEDURES:  The patient had a debridement of his scrotum performed  as mentioned in the history above.   DIET:  Low fat diet.   ACTIVITY:  Patient advised to increase activity slowly.   FOLLOWUP:  1. The patient has a followup with [facility] for a panniculectomy in three to four weeks on      August 17, 2005, at 8:15 a.m.  2. Primary care physician, Dr. Felecia Shelling, on August 11, 2005, at 9 a.m.  3. Dr. Dennie Maizes, Urology, in two weeks.  4. Home Health is to arrange IV antibiotics.   HOSPITAL COURSE:  Mr. Zingg is a 60 year old Caucasian male, who has not  been to a physician in 8 to 10 years, with no significant medical problems  other than morbid obesity.  The patient weighs over 500 pounds.  Family  review, history and physical by Dr. Rito Ehrlich.  He was admitted to the  hospital with cellulitis of his scrotal wall.  He was stable with no  evidence of sepsis, and at that time he was admitted.   He did have cellulitis and was started empirically on Levaquin; however, his  cultures did grow MRSA for which he was switched to Vancomycin.  He did  fairly well with no significant leukocytosis or fever during the course of  hospitalization, and the patient was also seen by Urology for the  debridement of the scrotum.   The patient was also advised that he would need a panniculectomy for which  he has an appointment done.   Overall, patient did fairly well on the day of discharge with no concerns.   The patient will need a fair amount of weight loss in order to reduce his  risk for an adverse health outcome in the next few years.      Margaretmary Dys, M.D.  Electronically Signed     AM/MEDQ  D:  08/27/2005  T:  08/27/2005  Job:  [id number]

## 2010-05-20 NOTE — H&P (Signed)
NAMENESTOR, WIENEKE NO.:  [id number]   MEDICAL RECORD NO.:  [id number]          PATIENT TYPE:  INP   LOCATION:  A313                          FACILITY:  [facility]   PHYSICIAN:  Osvaldo Shipper, MD     DATE OF BIRTH:  May 04, 1950   DATE OF ADMISSION:  07/26/2005  DATE OF DISCHARGE:  LH                                HISTORY & PHYSICAL   The patient does not have a primary medical doctor.   ADMISSION DIAGNOSES:  1.  Scrotal cellulitis.  2.  Rule out orchitis.  3.  Morbid obesity with weight of about 500 pounds.  4.  Lower abdominal mass, likely pannus.   CHIEF COMPLAINT:  Pain in the testicular area.   HISTORY OF PRESENT ILLNESS:  The patient is a 60 year old Caucasian male who  has not been to a physician over the past 8 to 10 years, who really does not  have any medical problems except that he is morbid obesity with a weight  over 500 pounds. The patient mentioned that he was fine up until Monday  night when he started feeling pain in his testicular area, more so on the  left side than the right side. He noticed that the left scrotal area was  getting enlarged. Pain increased in severity over the following one day. The  next morning, he could not tolerate the pain any more and decided to come to  the ED. He had some chills at home yesterday but did not measure his  temperature. He has not noticed any discharge from that area. He denies any  dysuria. He denies any discharge from his penis. Pain was 10/10 in intensity  when he came in. He has been given pain shots with which his pain intensity  has come down to about 5/10.   The patient also mentions that he has a mass in his lower abdomen which was  detected by the ED physician. However, the patient mentions that he has had  this mass for over 7 to 8 years now and has been slowly increasing in size.  He states it impairs his ambulation; otherwise does not cause him any  discomfort. Denies any weight loss. He has  constipation on and off. When he  is very constipated, he has some blood in the stool, otherwise none. Denies  any melena.   MEDICATIONS AT HOME:  None. Tylenol p.r.n. over the counter.   ALLERGIES:  No known drug allergies.   PAST MEDICAL HISTORY:  Really unremarkable except for his obesity. There is  surgical history of some surgical procedure to the right leg as well as to  his left knee possibly.   SOCIAL HISTORY:  Lives in [place] with his mother and his son. He does  not work. He said he quit smoking for 10 years, and he restarted about a  year ago. He smokes a half pack of cigarettes a day. Drinks about 2 to 3  times a week about 3 to 4 beers; last drink was on Sunday. He mentions use  of cocaine with last use a few weeks  ago.   FAMILY HISTORY:  Significant for diabetes, arthritis, but no cancer in the  family.   REVIEW OF SYSTEMS:  Otherwise unremarkable. He says that he gets shortness  of breath with ambulation which is his usual baseline. Otherwise, he denies  any other complaints.   PHYSICAL EXAMINATION:  VITAL SIGNS:  Temperature 99.3, blood pressure  135/58, heart rate in the 70s, respiratory rate 18. Saturation 96% on room  air.  GENERAL:  This is a morbidly obese individual, pleasant to talk to, in no  distress.  HEENT:  There is no pallor. No icterus. Oral mucous membrane is moist. No  oral lesions are noted.  LUNGS:  Clear to auscultation bilaterally.  CARDIOVASCULAR:  S1 and S2 is normal, regular. No murmurs appreciated.  ABDOMEN:  Extremely obese, really cannot appreciate any mass, but it is  impossible to be sure. He does have a mass which drops off from the lower  aspect of his abdomen. It is not really pedunculated, but it is definitely  freely mobile. The skin over this is very hard, and the skin is extremely  indurated. No tenderness present. The lower part of this mass is definitely  warm to touch.  GENITOURINARY:  Erythema is present over both his  scrotum, but the left one  is much enlarged than the right. I can feel the right testicle which appears  to be normal in size, but I cannot appreciate the left testicle. The area is  exquisitely tender to palpation. There is one area about a 0.5-cm oval area  which had some yellowish exudate, but no active drainage is noted.   LABORATORY DATA:  White count 11.2, hemoglobin 14.6, neutrophils 82%,  platelet count 170, MCV 88. CMET shows a glucose of 167 and albumin of 3.0  with a total protein of 5.9, otherwise unremarkable.   He did have an ultrasound of the scrotum; the report is pending at this  time; preliminary report, did not show any abscess or evidence of torsion.  CT of the abdomen was attempted. However, the patient too big for the CT  table over here.   IMPRESSION:  This is a 60 year old Caucasian male who does not have any  medical problems except for morbid obesity who presents with pain in his  testicular area. He definitely has evidence for cellulitis. He could have  orchitis and epididymitis as well. No abscess seen on ultrasound per  preliminary report. He also has a lower abdominal mass which is quite huge  and almost looks like a pannus. I do not think that this is kind of tumor  from any infra-abdominal organ. A CT scan could not be done because of  patient's weight.   PLAN:  1.  Genitourinary. Will start the patient on Levaquin. He has been given      Unasyn. I think Levaquin should cover most organisms in this setting. I      will have the patient seen by Dr. Dennie Maizes of urology to see if he      has to add anything, especially since I cannot feel his left testes.      Pain control will be achieved with Dilaudid. Cultures will be sent from      the area that is visible. Blood cultures will also be sent, though he      has already received antibiotics.  2.  Abdominal mass. Most likely a pannus. I will have Dr. Lovell Sheehan take a     look  at this. Radiologist, Dr.  Jean Rosenthal, told me that they tried to      ultrasound this mass. However, the sonographic waves could not penetrate      enough to get a sense of what this mass could be. But this most likely      looks like a pannus. In any case, this has been ongoing for about 7 to 8      years now. If he needs imaging study, I think this can be done as an      inpatient.      Apparently [facility] does have capability to do CT's on large      patients. The patient may be referred to that center on discharge.   I anticipate patient staying here for 2 to 3 days. The patient is  unassigned. The unassigned physician is Dr. Felecia Shelling.      Osvaldo Shipper, MD  Electronically Signed     GK/MEDQ  D:  07/26/2005  T:  07/26/2005  Job:  [id number]   cc:   Dennie Maizes, M.D.  Fax: [phone]   Dalia Heading, M.D.  Fax: [phone]

## 2010-05-20 NOTE — Consult Note (Signed)
NAMEKARDER, GOODIN              ACCOUNT NO.:  [id number]   MEDICAL RECORD NO.:  [id number]          PATIENT TYPE:  INP   LOCATION:  A220                          FACILITY:  [facility]   PHYSICIAN:  Dennie Maizes, M.D.   DATE OF BIRTH:  11-06-1950   DATE OF CONSULTATION:  07/29/2005  DATE OF DISCHARGE:                                   CONSULTATION   CHIEF COMPLAINT:  Scrotal swelling and pain, scrotal cellulitis.   This is a 60 year old, morbidly, obese male who was admitted to hospital  with scrotal swelling and pain.  Clinical evaluation was suggestive of  scrotal cellulitis.  Appropriate cultures were done and the patient was  started on IV vancomycin and Levaquin.  As stated, the antibiotics were  changed to IV vancomycin and gentamycin.  The patient has increasing  swelling and pain over the scrotum today.  He also has mild drainage of pus  from the scrotal wound.  A Foley catheter has been inserted which is  draining clear urine.  The patient has been noted to have atrial  fibrillation.  He has been seen by the cardiologist and started on aspirin.  He is also on Lovenox for DVT prophylaxis.   PHYSICAL EXAMINATION:  ABDOMEN:  No change.  SCROTUM:  There is increase in the scrotal edema especially on the left and  the right side.  There is increase in the scrotal swelling on the left side.  There is fluctuation of the scrotal wall suggestive of scrotal abscess.  There is drainage of a small amount from the left hemiscrotum.  Testes are  normal.   IMPRESSION:  Scrotal cellulitis, possible scrotal abscess.   PLAN:  1.  Will keep the patient NPO.  2.  Incision and drainage of scrotal abscess under anesthesia.  The CRNA is      going to evaluate the patient to see if this can be done at [facility].  Will discuss with Dr. Osvaldo Shipper.      Dennie Maizes, M.D.  Electronically Signed     SK/MEDQ  D:  07/29/2005  T:  07/29/2005  Job:  [id number]

## 2010-05-20 NOTE — Group Therapy Note (Signed)
NAME:  [name], [name] NO.:  [id number]   MEDICAL RECORD NO.:  [id number]          PATIENT TYPE:  INP   LOCATION:  A220                          FACILITY:  [facility]   PHYSICIAN:  Osvaldo Shipper, MD     DATE OF BIRTH:  [birth year]   DATE OF PROCEDURE:  DATE OF DISCHARGE:                                   PROGRESS NOTE   SUBJECTIVE:  The patient is very concerned that his scrotal swelling on the  left side especially is increasing.  But his pain appears to be better.  He  says he is feeling more comfortable now.  Otherwise, he really does not have  any other concerns or complaints.   OBJECTIVE:  The patient is morbidly obese with a weight over 500 pounds.   VITALS:  He remains afebrile.  Temperature 98.4.  Heart rate in the 90s to  110.  Respiratory rate is about 18.  Blood pressure 118/62.  Saturation 97%  on room air.  HEART EXAM:  S1, S2 is irregularly-irregular.  Slightly tachycardic.  LUNGS:  Clear to auscultation anteriorly.  ABDOMEN:  Obese with a large pannus inferiorly.  GU EXAM:  His left scrotal size is definitely much more enlarged than  before.  There is one area of exudate noted on the left scrotum but there is  no active drainage.  His tenderness is also improved but is still  erythematous.  EXTREMITIES:  Without edema.   LAB DATA:  His white count is improved to 9.8 today.  Hemoglobin stable at  13.1.  Platelet count 175.  Sodium 134.  Glucose 142.  Gentamicin level 1.8.  His wound culture is now growing MRSA which is sensitive to Vancomycin and  Gentamicin, intermediate sensitive to Levaquin, and sensitive to Bactrim.  This is most likely community-acquired MRSA.   ASSESSMENT/PLAN:  1.  Scrotal cellulitis.  The patient is afebrile.  His symptoms are improved      but his swelling is remarkably increasing.  I wonder if there is some      focal abscess collection inside his scrotum.  This was not noted on an      ultrasound done at the time of  admission, but his swelling was not quite      as significant as it is today.  I am going to discuss this issue with      Dr. Dennie Maizes.  We might have to consider repeating imaging study      of his scrotum.  For now, continue Vancomycin and Gentamicin for the      MRSA that has been detected in the wound.  It appears the patient will      need to have IV Vancomycin to treat his infection.  I do not think he      can be switched over to p.o. Bactrim, and I think he is going to need a      PICC line.  This will be arranged for him on Monday.  2.  Atrial fibrillation, unclear as to the onset, likely secondary to his  infection.  Unfortunately, because of patient's weight, no cardiac      evaluation can be done.  Only an EKG could be done so far.  He is on      Cardizem which was changed over to a short-acting regimen yesterday.      Should adjust the dose to achieve better rate control.  His TSH level      was unremarkable.  Chest x-ray was done yesterday which shows      cardiomegaly with no failure and poorly-inflated lungs.  He is not a      candidate for anticoagulation and he has just been put on aspirin. Above      recommendations made by Dr. Dorethea Clan.  3.  Large pannus present in the lower abdomen, it is quite remarkable.  Dr.      Lovell Sheehan did see this and unfortunately said that there is nothing he can      do about it and he said the patient should be referred to plastic      surgery at [facility] and we are attempting to do so.  4.  DVT GI prophylaxis ongoing.  He is on nicotine patch for his tobacco      use.  He has been put on aspirin for his atrial fibrillation.      Osvaldo Shipper, MD  Electronically Signed     GK/MEDQ  D:  07/29/2005  T:  07/29/2005  Job:  [id number]   cc:   Dennie Maizes, M.D.  Fax: [phone]

## 2010-06-28 ENCOUNTER — Other Ambulatory Visit (HOSPITAL_COMMUNITY): Payer: Self-pay | Admitting: Family Medicine

## 2010-06-28 ENCOUNTER — Other Ambulatory Visit (HOSPITAL_COMMUNITY): Payer: Self-pay

## 2010-06-28 DIAGNOSIS — M7989 Other specified soft tissue disorders: Secondary | ICD-10-CM

## 2010-06-29 ENCOUNTER — Ambulatory Visit (HOSPITAL_COMMUNITY)
Admission: RE | Admit: 2010-06-29 | Discharge: 2010-06-29 | Disposition: A | Discharge status: Home / Self Care | Payer: Medicare Other | Source: Ambulatory Visit | Attending: Family Medicine | Admitting: Family Medicine

## 2010-06-29 ENCOUNTER — Inpatient Hospital Stay (HOSPITAL_COMMUNITY)
Admission: EM | Admit: 2010-06-29 | Discharge: 2010-07-02 | DRG: 254 | Disposition: A | Discharge status: Home / Self Care | Payer: Medicare Other | Source: Ambulatory Visit | Attending: Internal Medicine | Admitting: Internal Medicine

## 2010-06-29 ENCOUNTER — Emergency Department (HOSPITAL_COMMUNITY): Payer: Medicare Other

## 2010-06-29 DIAGNOSIS — I890 Lymphedema, not elsewhere classified: Secondary | ICD-10-CM | POA: Diagnosis present

## 2010-06-29 DIAGNOSIS — I82409 Acute embolism and thrombosis of unspecified deep veins of unspecified lower extremity: Secondary | ICD-10-CM

## 2010-06-29 DIAGNOSIS — Z7901 Long term (current) use of anticoagulants: Secondary | ICD-10-CM

## 2010-06-29 DIAGNOSIS — M7989 Other specified soft tissue disorders: Secondary | ICD-10-CM

## 2010-06-29 DIAGNOSIS — I824Y9 Acute embolism and thrombosis of unspecified deep veins of unspecified proximal lower extremity: Principal | ICD-10-CM | POA: Diagnosis present

## 2010-06-29 DIAGNOSIS — I4891 Unspecified atrial fibrillation: Secondary | ICD-10-CM | POA: Diagnosis present

## 2010-06-29 DIAGNOSIS — F1411 Cocaine abuse, in remission: Secondary | ICD-10-CM | POA: Diagnosis present

## 2010-06-29 LAB — CBC
MCH: 30 pg (ref 26.0–34.0)
Platelets: 156 10*3/uL (ref 150–400)
RBC: 5.26 MIL/uL (ref 4.22–5.81)

## 2010-06-29 LAB — BASIC METABOLIC PANEL
BUN: 17 mg/dL (ref 6–23)
CO2: 29 mEq/L (ref 19–32)
Calcium: 9 mg/dL (ref 8.4–10.5)
Creatinine, Ser: 0.87 mg/dL (ref 0.50–1.35)
Glucose, Bld: 125 mg/dL — ABNORMAL HIGH (ref 70–99)
Sodium: 137 mEq/L (ref 135–145)

## 2010-06-29 LAB — DIFFERENTIAL
Eosinophils Absolute: 0.1 10*3/uL (ref 0.0–0.7)
Monocytes Absolute: 0.6 10*3/uL (ref 0.1–1.0)
Neutro Abs: 3.7 10*3/uL (ref 1.7–7.7)
Neutrophils Relative %: 61 % (ref 43–77)

## 2010-06-29 LAB — PROTIME-INR: INR: 2.19 — ABNORMAL HIGH (ref 0.00–1.49)

## 2010-06-30 DIAGNOSIS — I82409 Acute embolism and thrombosis of unspecified deep veins of unspecified lower extremity: Secondary | ICD-10-CM

## 2010-06-30 LAB — CBC
MCH: 29.4 pg (ref 26.0–34.0)
MCHC: 33.4 g/dL (ref 30.0–36.0)
MCV: 87.9 fL (ref 78.0–100.0)
Platelets: 159 10*3/uL (ref 150–400)
RBC: 4.87 MIL/uL (ref 4.22–5.81)

## 2010-06-30 LAB — DIFFERENTIAL
Eosinophils Absolute: 0.2 10*3/uL (ref 0.0–0.7)
Lymphs Abs: 1.8 10*3/uL (ref 0.7–4.0)
Monocytes Absolute: 0.6 10*3/uL (ref 0.1–1.0)
Monocytes Relative: 10 % (ref 3–12)
Neutrophils Relative %: 54 % (ref 43–77)

## 2010-06-30 LAB — HEPARIN LEVEL (UNFRACTIONATED)
Heparin Unfractionated: 0.26 IU/mL — ABNORMAL LOW (ref 0.30–0.70)
Heparin Unfractionated: 0.21 IU/mL — ABNORMAL LOW (ref 0.30–0.70)

## 2010-07-01 ENCOUNTER — Inpatient Hospital Stay (HOSPITAL_COMMUNITY): Payer: Medicare Other

## 2010-07-01 DIAGNOSIS — I82409 Acute embolism and thrombosis of unspecified deep veins of unspecified lower extremity: Secondary | ICD-10-CM

## 2010-07-01 LAB — PROTIME-INR
INR: 2.01 — ABNORMAL HIGH (ref 0.00–1.49)
Prothrombin Time: 23.1 seconds — ABNORMAL HIGH (ref 11.6–15.2)

## 2010-07-01 MED ORDER — IOHEXOL 300 MG/ML  SOLN
150.0000 mL | Freq: Once | INTRAMUSCULAR | Status: AC | PRN
  Administered 2010-07-01: 40 mL via INTRAVENOUS

## 2010-07-07 NOTE — Discharge Summary (Signed)
[name], [name] NO.:  [id number]  MEDICAL RECORD NO.:  [id number]  LOCATION:  A307                          FACILITY:  [facility]  PHYSICIAN:  Ambriella Kitt L. Lendell Caprice, MDDATE OF BIRTH:  [birth year]  DATE OF ADMISSION:  06/29/2010 DATE OF DISCHARGE:  06/30/2012LH                              DISCHARGE SUMMARY   DISCHARGE DIAGNOSES: 1. Acute right leg deep venous thrombosis. 2. Chronic atrial fibrillation maintained on Coumadin. 3. Morbid obesity. 4. Previous history of polysubstance abuse. 5. Status post IVC filter during this hospitalization.  DISCHARGE MEDICATIONS: 1. Coumadin 10 mg daily every day except Tuesdays and Fridays. 2. Coumadin 7.5 mg p.o. every Tuesday and Friday. 3. Diltiazem CD 240 mg a day. 4. Tylenol Arthritis daily as needed for pain. 5. Oxycodone 5 mg p.o. q.6 h. p.r.n. severe pain, 15 were dispensed.  Follow up with [facility] to keep goal INR between 2.5 and 3.0.  CONSULTATIONS:  Interventional Radiology and Hematology/Oncology.  PROCEDURES:  IVC filter.  DIET:  Should be heart-healthy, Coumadin friendly, low-calorie.  CONDITION:  Stable.  ACTIVITY:  Ad lib.  LABORATORY DATA:  On admission CBC normal, INR 2.19, PTT 41.  At discharge after holding Coumadin several days, his INR was 1.8.  Basic metabolic panel unremarkable.  MRSA screen negative.  DIAGNOSTICS:  Ultrasound of the right leg showed distal right femoral vein DVT.  HISTORY AND HOSPITAL COURSE:  Please see H&P for complete admission details.  [name] is a pleasant 60 year old white male on Coumadin for chronic atrial fibrillation.  He had several days' worth of worsening right leg pain.  He also had several weeks' worth of worsening edema.  He has chronic elephantiasis of the right leg but it was much worse recently.  He had an outpatient Doppler done which showed a DVT and he was admitted.  He was therapeutic on Coumadin.  He  has never had screening colonoscopy.  He is previous smoker.  He still drinks alcohol but has cut down quite a bit and has not used cocaine for over 6 months. He was started on a heparin drip and his Coumadin was held. Interventional Radiology was consulted and placed an IVC filter Hematology was consulted.  Dr. Donnie Coffin agrees with IVC filter and agrees with continuing Coumadin to goal INR between 2.5 and 3.0.  The patient would benefit from a screening CT of the chest but he weighs over 530 pounds and would not fit on our CAT scanner.  He also would benefit from screening colonoscopy, but would be high risk for procedure.  The patient reports that he is being evaluated currently for bariatric surgery at [facility].  If he does lose weight, certainly these would be in order.  It is possible that his DVT is from the obesity and resulting in activity alone.  He was on a different dosing schedule of Coumadin prior to admission and will need followup in a few weeks for an INR check.     [name], MD     CLS/MEDQ  D:  07/02/2010  T:  07/02/2010  Job:  [id number]  cc:   Corrie Mckusick, M.D. Fax: [phone]  [facility]  Electronically Signed by Crista Curb MD on 07/07/2010 07:25:38 PM

## 2010-07-07 NOTE — H&P (Signed)
NAMEMATTHEW, CINA NO.:  [id number]  MEDICAL RECORD NO.:  [id number]  LOCATION:  A307                          FACILITY:  [facility]  PHYSICIAN:  Aileene Lanum L. Lendell Caprice, MDDATE OF BIRTH:  03/10/1950  DATE OF ADMISSION:  06/29/2010 DATE OF DISCHARGE:  LH                             HISTORY & PHYSICAL   CHIEF COMPLAINT:  Blood clot.  HISTORY OF PRESENT ILLNESS:  Mr. Smeltz is a pleasant 61 year old white male who presents after having an outpatient Doppler of his legs, which showed right-sided DVT.  He is on Coumadin for atrial fibrillation and his INR today is 2.1.  He denies any chest pain or shortness of breath. He has a history of chronic atrial fibrillation and has been on Coumadin for several years.  He reports that his Coumadin dose has remained unchanged for about 9 months, as his INR has remained so stable.  He has no previous history of DVT or PE.  His grandfather had a blood clot after an accident, but no other family history of clotting problems. The patient reports that he has chronic leg edema, but has become much worse over the past several weeks.  His right leg is usually more swollen than his left, but it is even worse than usual.  He also reports pain particularly with weightbearing.  He is morbidly obese, but is fairly active.  He bowls and lives in [place].  He occasionally walks with a cane when he has to walk some distance, but usually gets around the house without any difficulty.  PAST MEDICAL HISTORY: 1. Atrial fibrillation. 2. Morbid obesity. 3. 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  MEDICATIONS: 1. Diltiazem CD 240 mg a day. 2. Coumadin 10 mg on Monday, Wednesday, Saturday, 7.5 mg every other     day. 3. Tylenol as needed.  He reports an allergy to vancomycin, which causes a rash.  SOCIAL HISTORY:  The patient quit smoking several years ago.  He drinks about a 6 packs of beer per  week.  He is a former cocaine user and has been clean from drugs for about 6 months.  He usually snorts cocaine. He denies history of IV drug abuse.  FAMILY HISTORY:  His mother recently died and had lung cancer.  She was age 51.  REVIEW OF SYSTEMS:  CONSTITUTIONAL:  No fevers or chills.  HEENT:  No headaches, sore throat.  RESPIRATORY:  As above.  CARDIOVASCULAR:  As above.  GI:  No history of vomiting or diarrhea.  No history of GI bleed.  GU:  He reports incontinence, particularly at night.  He takes a medication, but cannot recall the name.  He has noticed no improvement with it.  SKIN:  No rash.  PSYCHIATRIC:  No depression.  NEUROLOGIC:  No history of stroke or seizure.  HEMATOLOGIC:  As above.  ENDOCRINE:  No diabetes.  PHTHISICAL EXAMINATION:  VITAL SIGNS:  Temperature is 97.7, blood pressure 127/64, pulse 64, respiratory rate 24, oxygen saturation 99% on room air. GENERAL:  The patient is a morbidly obese white male in no acute distress. HEENT:  Normocephalic, atraumatic.  Pupils equal, round, and reactive tolight.  Sclerae nonicteric.  Moist mucous membranes. NECK:  Thick and supple. LUNGS:  Clear to auscultation bilaterally without wheezes, rhonchi, or rales. CARDIOVASCULAR:  Regular rate and rhythm without murmurs, gallops, or rubs. ABDOMEN:  Obese, soft, and nontender.  She has a large lower abdominal surgical scar.  No evidence of cellulitis or intertrigo. EXTREMITIES:  He has a massively swollen right leg, which is nonpitting. There is no evidence of cellulitis.  Left leg is less swollen and has a compression stocking on it. PSYCHIATRIC:  Normal affect. NEUROLOGIC:  Alert and oriented.  Cranial nerves and sensorimotor exam are intact. SKIN:  No ulcerations.  No rash.  LABORATORY DATA:  CBC normal.  Basic metabolic panel normal.  INR 2.19, PTT 41.  EKG shows atrial fibrillation and a PVC.  Chest x-ray shows cardiomegaly without CHF or pneumonia.  Doppler of the  leg shows an extremely limited study, but there is an indeterminate deep vein thrombus in the distal left femoral vein according to the body of the report, but then it goes on to say that the thrombosis is in the right femoral vein.  ASSESSMENT AND PLAN: 1. Acute deep vein thrombosis of the leg.  I will call Dr. Tyron Russell to     clarify, which side the deep vein thrombosis is on.  He is already     on Coumadin for atrial fibrillation.  He is at the lower limit of     normal, but is therapeutic.  I will consult Hematology/Oncology but     for now, the plan would be to hold his Coumadin, start a heparin     drip, consult Interventional Radiology for an inferior vena cava     filter and then keep his Coumadin dose for a goal INR between 2.5     and 3.  Other option would be to switch to an alternate     anticoagulant.  Either way it would be safest to place an inferior     vena cava filter. 2. Atrial fibrillation, rate controlled. 3. Morbid obesity.     Rigoberto Repass L. Lendell Caprice, MD     CLS/MEDQ  D:  06/29/2010  T:  06/30/2010  Job:  [id number]  cc:   Corrie Mckusick, M.D. Fax: [phone]  [facility] Electronically Signed by Crista Curb MD on 07/07/2010 07:25:34 PM

## 2010-07-29 NOTE — Consult Note (Signed)
[name], [name] NO.:  [id number]  MEDICAL RECORD NO.:  [id number]  LOCATION:  A307                          FACILITY:  [facility]  PHYSICIAN:  Pierce Crane, M.D., F.R.C.P.C.DATE OF BIRTH:  03/30/1950  DATE OF CONSULTATION: DATE OF DISCHARGE:                                CONSULTATION   REFERRING PHYSICIAN:  Corinna L. Lendell Caprice, MD  REASON FOR REFERRAL:  Right lower extremity DVT while on Coumadin anticoagulation for chronic atrial fibrillation.  HISTORY OF PRESENT ILLNESS:  This is a 60 year old Caucasian male who presents to the [facility] with right lower extremity erythema, heat, swelling, and an outpatient right lower extremity Doppler ultrasound  that is positive for a DVT.  This patient has a significant past medical history for chronic atrial fibrillation, morbid obesity, and history of MRSA and hypertension.  The patient explains to me that he is on chronic Coumadin therapy for atrial fibrillation.  He denies ever being cardioverted.  He tells me that his INRs have been fairly consistent over the past few months falling in the range of 2.7-3.0.  However, his INR on presentation to the hospital was 2.1.  The patient denies having a history of a blood clot.  He reports a story of a spider bite to his right lower extremity 30 years ago.  According to the patient, it sounds as if Dr. Lovell Sheehan, incision and drainage of a  potential abscess of the right lower extremity.  The patient explains  since then his right lower extremity swelling has been improved.  The patient reports a story that approximately 3 weeks ago, he noticed his right lower extremity was red and was approximately 1/6th of the size it normally is.  He has chronic edema of the right lower extremity, but he does explain that in the past 3 weeks, it has gotten slightly bigger.  The patient denies any complaints presently.  He denies any headache, dizziness, double vision,  fevers, chills, night sweats, nausea, vomiting, diarrhea, constipation, abdominal pain, chest pain, heart palpitations, shortness of breath, blood in stool, black tarry stool, urinary pain, urinary burning, urinary frequency, hematuria.  PAST MEDICAL HISTORY: 1. Chronic atrial fibrillation, on anticoagulation therapy, namely     Coumadin. 2. Hypertension. 3. Morbid obesity. 4. History of MRSA infection.  ALLERGIES:  VANCOMYCIN causes rash.  MEDICATIONS AT HOME: 1. Diltiazem CD 240 mg daily. 2. Coumadin 10 mg on Monday, Wednesday, and Saturday; 7.5 mg every     other day. 3. Acetaminophen as needed.  INPATIENT MEDICATIONS: 1. Diltiazem 240 mg daily. 2. Colace 100 mg b.i.d. 3. Heparin protocol per pharmacy. 4. Oxycodone 5 mg every 4 hours as needed.  PAST SURGICAL HISTORY: 1. Incision and drainage of scrotal abscess on July 29, 2005, by Dr.     Dennie Maizes. 2. Per patient conversation, incision and drainage of right lower     extremity abscess by Dr. Lovell Sheehan, approximately 15 years ago.  FAMILY HISTORY:  The patient explains that his grandfather was involved in a tractor accident which caused him to go to the hospital.  He ended up passing away in the hospital due to chronic problems according to the patient.  The  patient has 2 brothers.  He also has 2 children.  He has one son who lives in [place] and a daughter who lives in [place] with his 3 grandchildren.  No family history of cancer or bleeding disorders.  SOCIAL HISTORY:  The patient admits to drinking a 6-pack of alcohol per week.  The patient denies any tobacco abuse.  However, according to E- chart, the patient did abuse tobacco several years ago.  He denies any other drug use.  However, according to E-chart, it appears that the patient is a former cocaine user and has been cleaned from drugs for about 6 months.  He usually snorts cocaine.  He denies any history of IV drug abuse.  SCREENING TESTS:  The  patient denies of having a prostate exam or colonoscopy.  PHYSICAL EXAMINATION:  VITAL SIGNS:  Obtained at 1400 hours reveals a temperature of 97.9, pulse 103, respirations 18, blood pressure 155/66, oxygen saturation 94% on room air. GENERAL:  The patient is seen lying in bed.  He does not appear to be in acute distress.  He is alert and oriented x3.  He appears very pleasant. He is morbidly obese. HEENT:  Atraumatic, normocephalic.  Anicteric sclerae.  PERRLA. NECK:  Trachea midline. CARDIAC:  Regular rate and rhythm without murmur, rub, or gallop.  No S3 or S4 appreciated. LUNGS:  Clear to auscultation bilaterally without wheezes, rales, or rhonchi.  No accessory muscle use appreciated.  Decreased breath sounds in all fields likely due to body habitus. ABDOMEN:  Obese with positive bowel sounds in all four quadrants.  Soft, nontender. EXTREMITIES:  The patient has a right lower extremity that is massively swollen.  Nonpitting edema.  It is slightly warm to the touch and is erythematous.  Right calf reveals a posterior midline incisional scar from  a previous operation by Dr. Lovell Sheehan.  No upper extremity edema appreciated  bilaterally. LYMPHATICS:  No epitrochlear nodes appreciated. SKIN:  Capillary refill less than one second in fingers.  Skin is warm and dry.  Rapid turgor.  Radial pulse regular and strong at 2+. NEUROLOGIC:  No focal deficits appreciated.  The patient is alert and oriented x3.  LABORATORY DATA:  Obtained today reveals a white blood cell count of 5.5, hemoglobin 14.3, hematocrit 42.8, platelet count 159.  PT 27.9, INR 2.56.  RADIOGRAPHIC STUDIES:  Venous lower extremity ultrasound performed on June 30, 2010, reveals segment of intraluminal thrombus identified at the distal right femoral vein, visualized thrombus indeterminate in character for acute versus chronic.  Remaining deep venous system of the right lower extremity appears patent and compressible with  spontaneous flow.  Chest x-ray performed on June 29, 2010, reveals cardiomegaly without congestive heart failure or pneumonia.  ASSESSMENT: 1. Right lower extremity deep vein thrombosis while on Coumadin     anticoagulation. 2. Elephantiasis of the right lower extremity. 3. Chronic atrial fibrillation on rate control and Coumadin     anticoagulation. 4. Morbid obesity. 5. Hypertension. 6. History of methicillin-resistant Staphylococcus aureus infection.  Plan is as follows: 1. Agree with inferior vena cava filter placement by Interventional     Radiology. 2. Recommend screening CT of chest to rule out occult malignancy due      to patient's smoking history.  However, due to patient's weight      and size, I am unsure that the patient will fit in the CT scanner. 3. Recommend screening tests to rule out occult malignancy including a      PSA, prostate  examination, colonoscopy, etc. 4. The patient will have to remain on lifelong Coumadin due to chronic     atrial fibrillation.  We would recommend increasing his INR goal to     2.5-3.0.  All questions were answered.  The patient knows to call the clinic with any problems, questions, or concerns.  The patient and plan discussed with Dr. Pierce Crane, and he is in agreement with the aforementioned.    ______________________________ Maurine Minister Jacalyn Lefevre III, PA-C   ______________________________ Pierce Crane, M.D., F.R.C.P.C.    TSK/MEDQ  D:  06/30/2010  T:  07/01/2010  Job:  [id number]  Electronically Signed by Dellis Anes III PA on 07/01/2010 06:35:20 PM Electronically Signed by Pierce Crane MD on 07/29/2010 08:47:40 AM

## 2010-08-24 ENCOUNTER — Emergency Department (HOSPITAL_COMMUNITY)
Admission: EM | Admit: 2010-08-24 | Discharge: 2010-08-24 | Disposition: A | Discharge status: Home / Self Care | Payer: Medicare Other | Attending: Emergency Medicine | Admitting: Emergency Medicine

## 2010-08-24 ENCOUNTER — Emergency Department (HOSPITAL_COMMUNITY): Payer: Medicare Other

## 2010-08-24 DIAGNOSIS — M25559 Pain in unspecified hip: Secondary | ICD-10-CM | POA: Insufficient documentation

## 2010-08-24 DIAGNOSIS — I4891 Unspecified atrial fibrillation: Secondary | ICD-10-CM | POA: Insufficient documentation

## 2010-08-24 DIAGNOSIS — I10 Essential (primary) hypertension: Secondary | ICD-10-CM | POA: Insufficient documentation

## 2010-08-24 DIAGNOSIS — M25551 Pain in right hip: Secondary | ICD-10-CM

## 2010-08-24 DIAGNOSIS — IMO0001 Reserved for inherently not codable concepts without codable children: Secondary | ICD-10-CM | POA: Insufficient documentation

## 2010-08-24 HISTORY — DX: Essential (primary) hypertension: I10

## 2010-08-24 HISTORY — DX: Unspecified atrial fibrillation: I48.91

## 2010-08-24 HISTORY — DX: Obesity, unspecified: E66.9

## 2010-08-24 LAB — DIFFERENTIAL
Basophils Absolute: 0 10*3/uL (ref 0.0–0.1)
Basophils Relative: 0 % (ref 0–1)
Eosinophils Absolute: 0.1 10*3/uL (ref 0.0–0.7)
Eosinophils Relative: 2 % (ref 0–5)

## 2010-08-24 LAB — CBC
MCH: 29.8 pg (ref 26.0–34.0)
MCV: 86.2 fL (ref 78.0–100.0)
Platelets: 157 10*3/uL (ref 150–400)
RDW: 14.2 % (ref 11.5–15.5)
WBC: 5.4 10*3/uL (ref 4.0–10.5)

## 2010-08-24 LAB — BASIC METABOLIC PANEL
Calcium: 9.2 mg/dL (ref 8.4–10.5)
GFR calc Af Amer: >60 mL/min (ref >60)
GFR calc non Af Amer: >60 mL/min (ref >60)
Sodium: 138 mEq/L (ref 135–145)

## 2010-08-24 LAB — PROTIME-INR
INR: 3.52 — ABNORMAL HIGH (ref 0.00–1.49)
Prothrombin Time: 35.8 seconds — ABNORMAL HIGH (ref 11.6–15.2)

## 2010-08-24 MED ORDER — OXYCODONE-ACETAMINOPHEN 5-325 MG PO TABS
2.0000 | ORAL_TABLET | Freq: Once | ORAL | Status: AC
  Administered 2010-08-24: 2 via ORAL
  Filled 2010-08-24: qty 2

## 2010-08-24 NOTE — ED Notes (Signed)
Complain of pain in right hip for a week

## 2010-08-24 NOTE — ED Provider Notes (Signed)
History     CSN: [id number] Arrival date & time: 08/24/2010  9:52 AM  Chief Complaint  Patient presents with  . Hip Pain   Patient is a 60 y.o. male presenting with hip pain. The history is provided by the patient.  Hip Pain The current episode started 1 to 4 weeks ago. The problem occurs intermittently. The problem has been unchanged. Associated symptoms include arthralgias and myalgias. Pertinent negatives include no abdominal pain, change in bowel habit, chest pain, fever, headaches, joint swelling, nausea, neck pain, numbness, rash, urinary symptoms, vomiting or weakness. The symptoms are aggravated by exertion, standing, walking and twisting. He has tried rest for the symptoms. The treatment provided moderate relief.    Past Medical History  Diagnosis Date  . Obesity   . Hypertension   . Atrial fibrillation     Past Surgical History  Procedure Date  . Abdominal surgery     History reviewed. No pertinent family history.  History  Substance Use Topics  . Smoking status: Not on file  . Smokeless tobacco: Not on file  . Alcohol Use:       Review of Systems  Constitutional: Negative for fever.  HENT: Negative for neck pain.   Cardiovascular: Negative for chest pain.  Gastrointestinal: Negative for nausea, vomiting, abdominal pain and change in bowel habit.  Genitourinary: Negative for hematuria, decreased urine volume, penile swelling, scrotal swelling, difficulty urinating, penile pain and testicular pain.  Musculoskeletal: Positive for myalgias and arthralgias. Negative for back pain and joint swelling.  Skin: Negative for rash.  Neurological: Negative for weakness, numbness and headaches.  All other systems reviewed and are negative.    Physical Exam  BP 111/65  Pulse 73  Temp(Src) 97.6 F (36.4 C) (Oral)  Resp 24  Ht 5\' 11"  (1.803 m)  Wt 485 lb (219.995 kg)  BMI 67.64 kg/m2  SpO2 94%  Physical Exam  Nursing note and vitals reviewed. Constitutional: He  is oriented to person, place, and time. He appears well-developed and well-nourished. No distress.  HENT:  Head: Normocephalic and atraumatic.  Mouth/Throat: Oropharynx is clear and moist.  Cardiovascular: Normal rate, regular rhythm and normal heart sounds.   Pulmonary/Chest: Effort normal and breath sounds normal.  Abdominal: Soft. There is no tenderness.       Morbidly obese  Musculoskeletal: He exhibits tenderness. He exhibits no edema.       Right hip: He exhibits tenderness. He exhibits normal range of motion, normal strength, no swelling, no crepitus and no deformity.  Neurological: He is alert and oriented to person, place, and time. He has normal reflexes. No cranial nerve deficit or sensory deficit. He exhibits normal muscle tone. Coordination normal.  Reflex Scores:      Patellar reflexes are 2+ on the right side and 2+ on the left side.      Achilles reflexes are 2+ on the right side and 2+ on the left side. Skin: Skin is dry.    ED Course  Procedures  MDM   1240  Patient is sitting in a chair, feels better, NAD.  Drinking soda. Pt is ambulatory,no focal neuro deficits.  DP pulses are palpable bilaterally.   Has tenderness to anterior right hip that worsens with flexion and standing and improves with rest.  No increased swelling or erythema of the distal right leg to suggest another DVT.  NO hypoxia, tachycardia or tachypnea.  Pt is anticoagulated and has IVC filter.  He agrees to f/u with his PMD .  I have advised him of his imagining and lab results today.  I have also discussed pt hx and findings today with the EDP.   I have reviewed nursing notes and vital signs and previous medical records.   The patient appears reasonably screened and/or stabilized for discharge and I doubt any other medical condition or other [facility] requiring further screening, evaluation, or treatment in the ED at this time prior to discharge.   Makani Seckman L. Forrest Demuro, [place] 09/01/10 1409

## 2010-09-02 NOTE — ED Provider Notes (Signed)
Medical screening examination/treatment/procedure(s) were performed by non-physician practitioner and as supervising physician I was immediately available for consultation/collaboration.   Benny Lennert, MD 09/02/10 [phone]

## 2010-09-23 LAB — BASIC METABOLIC PANEL
BUN: 7
Chloride: 102
Creatinine, Ser: 0.81
GFR calc non Af Amer: >60

## 2010-09-23 LAB — CBC
MCV: 82.1
Platelets: 315
WBC: 9.2

## 2010-09-23 LAB — DIFFERENTIAL
Eosinophils Absolute: 0.1
Eosinophils Relative: 1
Lymphs Abs: 1.7
Monocytes Absolute: 0.8
Monocytes Relative: 9

## 2010-10-07 LAB — CBC
HCT: 34.6 — ABNORMAL LOW
Hemoglobin: 11.1 — ABNORMAL LOW
Hemoglobin: 11.3 — ABNORMAL LOW
Hemoglobin: 12 — ABNORMAL LOW
MCHC: 34
MCHC: 34.7
MCHC: 34.8
MCV: 87.4
MCV: 87.6
Platelets: 425 — ABNORMAL HIGH
Platelets: ADEQUATE
RBC: 3.96 — ABNORMAL LOW
RDW: 14.1
RDW: 14.4
RDW: 14.5
RDW: 14.9

## 2010-10-07 LAB — BASIC METABOLIC PANEL
BUN: 8
CO2: 25
Calcium: 8.1 — ABNORMAL LOW
Creatinine, Ser: 0.74
GFR calc non Af Amer: >60
Glucose, Bld: 118 — ABNORMAL HIGH
Sodium: 141

## 2010-10-07 LAB — DIFFERENTIAL
Eosinophils Relative: 2
Lymphocytes Relative: 10 — ABNORMAL LOW
Lymphs Abs: 1
Monocytes Relative: 7
Neutro Abs: 8.1 — ABNORMAL HIGH

## 2010-10-07 LAB — COMPREHENSIVE METABOLIC PANEL
AST: 36
Albumin: 1.5 — ABNORMAL LOW
Calcium: 7.6 — ABNORMAL LOW
Creatinine, Ser: 0.62
GFR calc Af Amer: >60

## 2010-10-10 LAB — BASIC METABOLIC PANEL
BUN: 12
BUN: 16
BUN: 26 — ABNORMAL HIGH
CO2: 19
CO2: 20
CO2: 21
CO2: 22
Calcium: 6.7 — ABNORMAL LOW
Calcium: 6.9 — ABNORMAL LOW
Calcium: 7.1 — ABNORMAL LOW
Calcium: 7.5 — ABNORMAL LOW
Chloride: 103
Chloride: 104
Chloride: 110
Creatinine, Ser: 0.75
Creatinine, Ser: 1.28
GFR calc Af Amer: >60
GFR calc Af Amer: >60
GFR calc Af Amer: >60
GFR calc Af Amer: >60
GFR calc Af Amer: >60
GFR calc non Af Amer: 50 — ABNORMAL LOW
GFR calc non Af Amer: >60
GFR calc non Af Amer: >60
GFR calc non Af Amer: >60
Glucose, Bld: 125 — ABNORMAL HIGH
Glucose, Bld: 156 — ABNORMAL HIGH
Glucose, Bld: 169 — ABNORMAL HIGH
Potassium: 3.4 — ABNORMAL LOW
Potassium: 3.7
Potassium: 3.8
Potassium: 3.8
Potassium: 3.9
Sodium: 129 — ABNORMAL LOW
Sodium: 133 — ABNORMAL LOW
Sodium: 136
Sodium: 137

## 2010-10-10 LAB — CBC
HCT: 31.5 — ABNORMAL LOW
HCT: 31.7 — ABNORMAL LOW
HCT: 33.4 — ABNORMAL LOW
HCT: 34.6 — ABNORMAL LOW
HCT: 35.3 — ABNORMAL LOW
HCT: 36.5 — ABNORMAL LOW
HCT: 39.3
Hemoglobin: 10.9 — ABNORMAL LOW
Hemoglobin: 11.3 — ABNORMAL LOW
Hemoglobin: 12.1 — ABNORMAL LOW
Hemoglobin: 12.4 — ABNORMAL LOW
Hemoglobin: 13.4
MCHC: 34.6
MCHC: 34.7
MCHC: 34.7
MCHC: 34.8
MCHC: 35
MCV: 87.5
MCV: 88.2
MCV: 88.5
MCV: 88.6
MCV: 88.8
Platelets: 234
Platelets: 288
Platelets: 354
Platelets: 360
Platelets: 396
Platelets: 444 — ABNORMAL HIGH
RBC: 3.25 — ABNORMAL LOW
RBC: 3.56 — ABNORMAL LOW
RBC: 3.98 — ABNORMAL LOW
RBC: 4.16 — ABNORMAL LOW
RDW: 13.7
RDW: 14
RDW: 14
RDW: 14.2
RDW: 14.3
RDW: 14.5
WBC: 10.8 — ABNORMAL HIGH
WBC: 11.3 — ABNORMAL HIGH
WBC: 11.7 — ABNORMAL HIGH
WBC: 14.2 — ABNORMAL HIGH

## 2010-10-10 LAB — BLOOD GAS, ARTERIAL
Bicarbonate: 18.7 — ABNORMAL LOW
Bicarbonate: 20.2
FIO2: 0.21
TCO2: 21.2
pCO2 arterial: 28.4 — ABNORMAL LOW
pCO2 arterial: 33.9 — ABNORMAL LOW
pH, Arterial: 7.392
pH, Arterial: 7.435
pO2, Arterial: 96.5

## 2010-10-10 LAB — COMPREHENSIVE METABOLIC PANEL
ALT: 18
ALT: 19
AST: 38 — ABNORMAL HIGH
AST: 46 — ABNORMAL HIGH
Albumin: 1.4 — ABNORMAL LOW
Alkaline Phosphatase: 59
BUN: 22
CO2: 20
CO2: 20
Calcium: 7.6 — ABNORMAL LOW
Chloride: 111
Chloride: 98
Creatinine, Ser: 0.74
Creatinine, Ser: 1.47
GFR calc Af Amer: >60
GFR calc Af Amer: >60
GFR calc non Af Amer: >60
GFR calc non Af Amer: >60
Glucose, Bld: 121 — ABNORMAL HIGH
Potassium: 3.3 — ABNORMAL LOW
Sodium: 135
Total Bilirubin: 1.1
Total Bilirubin: 1.6 — ABNORMAL HIGH
Total Protein: 6.1

## 2010-10-10 LAB — URINE MICROSCOPIC-ADD ON

## 2010-10-10 LAB — DIFFERENTIAL
Basophils Absolute: 0
Basophils Absolute: 0
Basophils Absolute: 0
Basophils Relative: 0
Eosinophils Absolute: 0.1 — ABNORMAL LOW
Eosinophils Relative: 0
Eosinophils Relative: 1
Lymphocytes Relative: 5 — ABNORMAL LOW
Lymphocytes Relative: 5 — ABNORMAL LOW
Lymphocytes Relative: 8 — ABNORMAL LOW
Lymphs Abs: 0.7
Monocytes Absolute: 0.8
Neutro Abs: 12.7 — ABNORMAL HIGH
Neutro Abs: 14.6 — ABNORMAL HIGH
Neutrophils Relative %: 90 — ABNORMAL HIGH

## 2010-10-10 LAB — URINE DRUGS OF ABUSE SCREEN W ALC, ROUTINE (REF LAB)
Barbiturate Quant, Ur: NEGATIVE
Creatinine,U: 172.8
Ethyl Alcohol: <5
Marijuana Metabolite: NEGATIVE
Methadone: NEGATIVE
Phencyclidine (PCP): NEGATIVE

## 2010-10-10 LAB — WOUND CULTURE: Gram Stain: NONE SEEN

## 2010-10-10 LAB — URINALYSIS, ROUTINE W REFLEX MICROSCOPIC
Glucose, UA: NEGATIVE
Glucose, UA: NEGATIVE
Leukocytes, UA: NEGATIVE
Nitrite: NEGATIVE
Specific Gravity, Urine: 1.02
Specific Gravity, Urine: 1.023
Urobilinogen, UA: 1
pH: 5.5
pH: 6

## 2010-10-10 LAB — CLOSTRIDIUM DIFFICILE EIA: C difficile Toxins A+B, EIA: NEGATIVE

## 2010-10-10 LAB — CULTURE, BLOOD (ROUTINE X 2)
Culture: NO GROWTH
Culture: NO GROWTH

## 2010-10-10 LAB — PROTIME-INR
INR: 1.2
Prothrombin Time: 15.1

## 2010-10-10 LAB — LIPID PANEL: Triglycerides: 64

## 2010-10-10 LAB — HEPATIC FUNCTION PANEL
ALT: 15
AST: 27
Alkaline Phosphatase: 65
Total Protein: 4.4 — ABNORMAL LOW

## 2010-10-10 LAB — URINE CULTURE: Culture: NO GROWTH

## 2010-10-10 LAB — RAPID URINE DRUG SCREEN, HOSP PERFORMED
Cocaine: NOT DETECTED
Tetrahydrocannabinol: NOT DETECTED

## 2010-10-10 LAB — HEAVY METALS, BLOOD
Lead: 0.7 ug/dL (ref <10.0)
Mercury: 4 ug/L (ref <5.0)

## 2010-10-10 LAB — SODIUM, URINE, RANDOM
Sodium, Ur: <10
Sodium, Ur: <10

## 2010-10-10 LAB — APTT: aPTT: 38 — ABNORMAL HIGH

## 2010-10-10 LAB — CORTISOL: Cortisol, Plasma: 19.5

## 2010-10-10 LAB — HEMOGLOBIN A1C: Hgb A1c MFr Bld: 5.7

## 2010-10-10 LAB — VITAMIN D 25 HYDROXY (VIT D DEFICIENCY, FRACTURES): Vit D, 25-Hydroxy: 13 — ABNORMAL LOW (ref 30–89)

## 2010-10-10 LAB — OSMOLALITY, URINE: Osmolality, Ur: 599

## 2011-04-25 ENCOUNTER — Emergency Department (HOSPITAL_COMMUNITY): Payer: Medicare Other

## 2011-04-25 ENCOUNTER — Emergency Department (HOSPITAL_COMMUNITY)
Admission: EM | Admit: 2011-04-25 | Discharge: 2011-04-25 | Disposition: A | Discharge status: Home / Self Care | Payer: Medicare Other | Attending: Emergency Medicine | Admitting: Emergency Medicine

## 2011-04-25 ENCOUNTER — Encounter (HOSPITAL_COMMUNITY): Payer: Self-pay | Admitting: *Deleted

## 2011-04-25 DIAGNOSIS — T50995A Adverse effect of other drugs, medicaments and biological substances, initial encounter: Secondary | ICD-10-CM | POA: Insufficient documentation

## 2011-04-25 DIAGNOSIS — M549 Dorsalgia, unspecified: Secondary | ICD-10-CM | POA: Insufficient documentation

## 2011-04-25 DIAGNOSIS — T45515A Adverse effect of anticoagulants, initial encounter: Secondary | ICD-10-CM | POA: Insufficient documentation

## 2011-04-25 DIAGNOSIS — M5430 Sciatica, unspecified side: Secondary | ICD-10-CM | POA: Insufficient documentation

## 2011-04-25 DIAGNOSIS — M5432 Sciatica, left side: Secondary | ICD-10-CM

## 2011-04-25 DIAGNOSIS — IMO0001 Reserved for inherently not codable concepts without codable children: Secondary | ICD-10-CM | POA: Insufficient documentation

## 2011-04-25 DIAGNOSIS — M7989 Other specified soft tissue disorders: Secondary | ICD-10-CM | POA: Insufficient documentation

## 2011-04-25 DIAGNOSIS — R269 Unspecified abnormalities of gait and mobility: Secondary | ICD-10-CM | POA: Insufficient documentation

## 2011-04-25 DIAGNOSIS — I4891 Unspecified atrial fibrillation: Secondary | ICD-10-CM | POA: Insufficient documentation

## 2011-04-25 DIAGNOSIS — T45511A Poisoning by anticoagulants, accidental (unintentional), initial encounter: Secondary | ICD-10-CM

## 2011-04-25 DIAGNOSIS — M25559 Pain in unspecified hip: Secondary | ICD-10-CM | POA: Insufficient documentation

## 2011-04-25 DIAGNOSIS — I10 Essential (primary) hypertension: Secondary | ICD-10-CM | POA: Insufficient documentation

## 2011-04-25 DIAGNOSIS — M25552 Pain in left hip: Secondary | ICD-10-CM

## 2011-04-25 LAB — GLUCOSE, CAPILLARY: Glucose-Capillary: 125 mg/dL — ABNORMAL HIGH (ref 70–99)

## 2011-04-25 MED ORDER — PREDNISONE 20 MG PO TABS
60.0000 mg | ORAL_TABLET | Freq: Once | ORAL | Status: AC
  Administered 2011-04-25: 60 mg via ORAL
  Filled 2011-04-25: qty 3

## 2011-04-25 MED ORDER — PREDNISONE 50 MG PO TABS: 50.0000 mg | ORAL_TABLET | Freq: Every day | ORAL | Status: AC

## 2011-04-25 MED ORDER — OXYCODONE-ACETAMINOPHEN 5-325 MG PO TABS
2.0000 | ORAL_TABLET | Freq: Once | ORAL | Status: AC
  Administered 2011-04-25: 2 via ORAL
  Filled 2011-04-25: qty 2

## 2011-04-25 MED ORDER — OXYCODONE-ACETAMINOPHEN 5-325 MG PO TABS: ORAL_TABLET | ORAL | Status: DC

## 2011-04-25 NOTE — ED Notes (Signed)
Pt c/o left hip pain that has gradually gotten worse since Saturday. Pt denies injury.

## 2011-04-25 NOTE — ED Provider Notes (Signed)
History     CSN: [id number]  Arrival date & time 04/25/11  0756   First MD Initiated Contact with Patient 04/25/11 [phone]      Chief Complaint  Patient presents with  . Hip Pain    (Consider location/radiation/quality/duration/timing/severity/associated sxs/prior treatment) HPI Comments: Pt denies any trauma.  He localizes pain to the L buttocks specifically in the piriformis region.  States he was here last year (08-24-10) with R hip pain.  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  He also had a DVT in R leg ~ 06-29-10 and was started on coumadin, which he is still taking and had greenfield filter insertion.    He had not had his INR checked in "over 3 months".  He denies any leg pain or swelling.  No SOB or CP.  Last seen by [name] ~ 1 month ago for routine visit.  Patient is a 61 y.o. male presenting with hip pain. The history is provided by the patient. No language interpreter was used.  Hip Pain This is a new problem. Episode onset: 3-4 days ago. The problem occurs constantly. The problem has been unchanged. Pertinent negatives include no chest pain, coughing, diaphoresis, fever, nausea or vomiting. The symptoms are aggravated by standing and walking. He has tried nothing for the symptoms.    Past Medical History  Diagnosis Date  . Obesity   . Hypertension   . Atrial fibrillation     Past Surgical History  Procedure Date  . Abdominal surgery     History reviewed. No pertinent family history.  History  Substance Use Topics  . Smoking status: Former Games developer  . Smokeless tobacco: Not on file  . Alcohol Use: Yes      Review of Systems  Constitutional: Negative for fever and diaphoresis.  Respiratory: Negative for cough and shortness of breath.   Cardiovascular: Negative for chest pain and leg swelling.  Gastrointestinal: Negative for nausea and vomiting.  Musculoskeletal: Positive for back pain and gait problem.  Hematological: Does not bruise/bleed easily.   All other systems reviewed and are negative.    Allergies  Other and Strawberry  Home Medications   Current Outpatient Rx  Name Route Sig Dispense Refill  . ACETAMINOPHEN ER 650 MG PO TBCR Oral Take 1,300 mg by mouth every 8 (eight) hours as needed. Pain    . DILTIAZEM HCL ER COATED BEADS 360 MG PO CP24 Oral Take 360 mg by mouth daily.      Marland Kitchen NAPHAZOLINE HCL 0.012 % OP SOLN Both Eyes Place 1 drop into both eyes daily as needed. Allergies    . TAMSULOSIN HCL 0.4 MG PO CAPS Oral Take 0.4 mg by mouth daily after supper.      . WARFARIN SODIUM 5 MG PO TABS Oral Take 7.5-10 mg by mouth daily. Takes 1 and a half tablet on Mon, Thursday and on Tues, Wed, Fri, Sat, Sun patient takes 2 tablets    . OXYCODONE-ACETAMINOPHEN 5-325 MG PO TABS  One tab po q 4-6 hrs prn pain 20 tablet 0  . PREDNISONE 50 MG PO TABS Oral Take 1 tablet (50 mg total) by mouth daily. 6 tablet 0    BP 162/94  Pulse 58  Temp(Src) 97.3 F (36.3 C) (Oral)  Resp 24  Ht 5\' 11"  (1.803 m)  Wt 500 lb (226.799 kg)  BMI 69.74 kg/m2  SpO2 95%  Physical Exam  Nursing note and vitals reviewed. Constitutional: He is oriented to person, place, and  time. He appears well-developed and well-nourished.  Non-toxic appearance. He does not have a sickly appearance. He does not appear ill. No distress.       Morbidly obese.  States he weighs "about 500 lbs".  HENT:  Head: Normocephalic and atraumatic.  Eyes: EOM are normal.  Neck: Normal range of motion.  Cardiovascular: Normal rate, regular rhythm, normal heart sounds and intact distal pulses.   Pulmonary/Chest: Effort normal and breath sounds normal. No accessory muscle usage. Not tachypneic. No respiratory distress. He has no decreased breath sounds. He has no wheezes. He has no rhonchi. He has no rales.  Abdominal: Soft. He exhibits no distension. There is no tenderness.  Musculoskeletal: Normal range of motion.       R lower leg is actually more swollen than the L.  He states  it has been like that for "over 30 yrs".    He has pain and PT that is well localizes to the piriformis area in the buttocks.    He has minimal pain in the hip area with rotation of the L leg passively.  Pain with standing is also in the buttocks and not well localized to the hip.  Neurological: He is alert and oriented to person, place, and time. Coordination normal.  Skin: Skin is warm and dry.  Psychiatric: He has a normal mood and affect. Judgment normal.    ED Course  Procedures (including critical care time)  Labs Reviewed  GLUCOSE, CAPILLARY - Abnormal; Notable for the following:    Glucose-Capillary 125 (*)    All other components within normal limits  PROTIME-INR - Abnormal; Notable for the following:    Prothrombin Time 35.1 (*)    INR 3.43 (*)    All other components within normal limits   Dg Hip Complete Left  04/25/2011  *RADIOLOGY REPORT*  Clinical Data: Left-sided hip pain, no injury  LEFT HIP - COMPLETE 2+ VIEW  Comparison: None.  Findings:  Examination is somewhat degraded secondary to patient body habitus. No fracture dislocation.  There is mild degenerative change of the left hip with joint space loss, subchondral sclerosis and osteophytosis.  Limited visualization of the pelvis is normal. Limited visualization of the bilateral SI joints and pubic symphysis is normal. Suspect severe degenerative change of the contralateral right hip, incompletely evaluated.  Regional soft tissues are normal.  IMPRESSION: 1.  No fracture.  Mild degenerative change of the left hip. 2.  Suspect severe degenerative change of the contralateral right hip.  Original Report Authenticated By: Waynard Reeds, M.D.     1. Left sided sciatica   2. Left hip pain   3. Coumadin toxicity       MDM  Discussed tx plan with dr. Lamar Blinks office.  Requests that pt not take coumadin for 2 days and then have INR re-checked through cardiologist's office.   doubt DVT.  Pain sounds more sciatic than hip in  origin. rx-prednisone 50 mg, 6 rx-percocet, [address] [place], [place] 04/25/11 1046

## 2011-04-25 NOTE — Discharge Instructions (Signed)
Call [name] for an appt.  If the hip/buttocks pain does not improve he may want to have you evaluated elsewhere.  He also recommends that you stop your coumadin for the next 2 days.  Call your cardiologist and make arrangements to have your INR re-checked in 2 days.

## 2011-04-26 MED ORDER — OXYCODONE-ACETAMINOPHEN 5-325 MG PO TABS: 1.0000 | ORAL_TABLET | ORAL | Status: AC | PRN

## 2011-04-26 MED ORDER — PREDNISONE 10 MG PO TABS: 20.0000 mg | ORAL_TABLET | Freq: Every day | ORAL | Status: AC

## 2011-04-26 NOTE — ED Provider Notes (Signed)
Medical screening examination/treatment/procedure(s) were performed by non-physician practitioner and as supervising physician I was immediately available for consultation/collaboration.  Nicoletta Dress. Colon Branch, MD 04/26/11 2024

## 2011-04-26 NOTE — Progress Notes (Signed)
Advised by nursing that patient has called stating his discharge papers which included his prescriptions given him yesterday are gone. He is asking for reprint of the medicines. Rx for percocet 5/325  #20  And prednisone 20 mg #6 written for him to pick up.

## 2011-08-08 ENCOUNTER — Encounter (HOSPITAL_COMMUNITY): Payer: Self-pay | Admitting: *Deleted

## 2011-08-08 ENCOUNTER — Emergency Department (HOSPITAL_COMMUNITY)
Admission: EM | Admit: 2011-08-08 | Discharge: 2011-08-08 | Disposition: A | Discharge status: Home / Self Care | Payer: Medicare Other | Attending: Emergency Medicine | Admitting: Emergency Medicine

## 2011-08-08 DIAGNOSIS — Z87891 Personal history of nicotine dependence: Secondary | ICD-10-CM | POA: Insufficient documentation

## 2011-08-08 DIAGNOSIS — I10 Essential (primary) hypertension: Secondary | ICD-10-CM | POA: Insufficient documentation

## 2011-08-08 DIAGNOSIS — L02419 Cutaneous abscess of limb, unspecified: Secondary | ICD-10-CM | POA: Insufficient documentation

## 2011-08-08 DIAGNOSIS — E669 Obesity, unspecified: Secondary | ICD-10-CM | POA: Insufficient documentation

## 2011-08-08 DIAGNOSIS — L0390 Cellulitis, unspecified: Secondary | ICD-10-CM

## 2011-08-08 DIAGNOSIS — L03119 Cellulitis of unspecified part of limb: Secondary | ICD-10-CM | POA: Insufficient documentation

## 2011-08-08 DIAGNOSIS — I4891 Unspecified atrial fibrillation: Secondary | ICD-10-CM | POA: Insufficient documentation

## 2011-08-08 HISTORY — DX: Cellulitis, unspecified: L03.90

## 2011-08-08 MED ORDER — CLINDAMYCIN HCL 150 MG PO CAPS: 300.0000 mg | ORAL_CAPSULE | Freq: Four times a day (QID) | ORAL | Status: AC

## 2011-08-08 MED ORDER — CLINDAMYCIN HCL 150 MG PO CAPS
300.0000 mg | ORAL_CAPSULE | Freq: Once | ORAL | Status: AC
  Administered 2011-08-08: 300 mg via ORAL
  Filled 2011-08-08: qty 2

## 2011-08-08 NOTE — ED Notes (Signed)
Redness and swelling to LLE.  Pedal pulses present.

## 2011-08-08 NOTE — ED Notes (Signed)
Redness to left lower leg. Pt states burning pain to area. Recently tx for cellulitis in right leg. NAD.

## 2011-08-08 NOTE — ED Provider Notes (Signed)
History    This chart was scribed for Charles B. Bernette Mayers, MD, MD by Smitty Pluck. The patient was seen in room APA07 and the patient's care was started at 12:00PM.   CSN: [id number]  Arrival date & time 08/08/11  1133   First MD Initiated Contact with Patient 08/08/11 1200      Chief Complaint  Patient presents with  . Leg Swelling    (Consider location/radiation/quality/duration/timing/severity/associated sxs/prior treatment) The history is provided by the patient.   [name] is a 61 y.o. male who presents to the Emergency Department complaining of constant, moderate redness and burning in lower left leg onset 1 day ago. Pt reports having cellulitis in right leg 3 weeks ago on R leg and was treated at [facility] (admitted and stayed 2 days). Denies fever. Pt reports that his leg felt warm. Pt reports using coumadin and was checked 1 month ago. Denies diabetes. Denies any other pain.  PCP is Dr. Gareth Morgan manages coumadin    Past Medical History  Diagnosis Date  . Obesity   . Hypertension   . Atrial fibrillation   . Cellulitis     Past Surgical History  Procedure Date  . Abdominal surgery     No family history on file.  History  Substance Use Topics  . Smoking status: Former Games developer  . Smokeless tobacco: Not on file  . Alcohol Use: Yes      Review of Systems  All other systems reviewed and are negative.   10 Systems reviewed and all are negative for acute change except as noted in the HPI.   Allergies  Other and Strawberry  Home Medications   Current Outpatient Rx  Name Route Sig Dispense Refill  . ACETAMINOPHEN ER 650 MG PO TBCR Oral Take 1,300 mg by mouth every 8 (eight) hours as needed. Pain    . DILTIAZEM HCL ER COATED BEADS 360 MG PO CP24 Oral Take 360 mg by mouth daily.      Marland Kitchen NAPHAZOLINE HCL 0.012 % OP SOLN Both Eyes Place 1 drop into both eyes daily as needed. Allergies    . OXYCODONE-ACETAMINOPHEN 5-325 MG PO TABS  One tab  po q 4-6 hrs prn pain 20 tablet 0  . TAMSULOSIN HCL 0.4 MG PO CAPS Oral Take 0.4 mg by mouth daily after supper.      . WARFARIN SODIUM 5 MG PO TABS Oral Take 7.5-10 mg by mouth daily. Takes 1 and a half tablet on Mon, Thursday and on Tues, Wed, Fri, Sat, Sun patient takes 2 tablets      BP 130/111  Pulse 84  Temp 97.4 F (36.3 C) (Oral)  Resp 20  Ht 5\' 11"  (1.803 m)  Wt 442 lb (200.49 kg)  BMI 61.65 kg/m2  SpO2 96%  Physical Exam  Nursing note and vitals reviewed. Constitutional: He is oriented to person, place, and time. He appears well-developed and well-nourished.  HENT:  Head: Normocephalic and atraumatic.  Eyes: EOM are normal. Pupils are equal, round, and reactive to light.  Neck: Normal range of motion. Neck supple.  Cardiovascular: Normal rate, normal heart sounds and intact distal pulses.   Pulmonary/Chest: Effort normal and breath sounds normal.  Abdominal: Bowel sounds are normal. He exhibits no distension. There is no tenderness.  Musculoskeletal: Normal range of motion. He exhibits no edema and no tenderness.       Left lower leg is red, tender, edematous and warm.   Neurological: He is alert and  oriented to person, place, and time. He has normal strength. No cranial nerve deficit or sensory deficit.  Skin: Skin is warm and dry. No rash noted.  Psychiatric: He has a normal mood and affect.    ED Course  Procedures (including critical care time) DIAGNOSTIC STUDIES: Oxygen Saturation is 96% on room air, normal by my interpretation.    COORDINATION OF CARE: 12:06PM EDP discusses pt ED treatment with pt     Labs Reviewed - No data to display No results found.   No diagnosis found.    MDM  Early cellulitis in a non-diabetic without signs of systemic bacteremia. Pt has tolerated clindamycin well. No need for lab work or admission at this time. Advised to followup with his PCP for a recheck in 24-48hrs or return to the ED for any worsening swelling or fever.    I personally performed the services described in the documentation, which were scribed in my presence. The recorded information has been reviewed and considered.         Charles B. Bernette Mayers, MD 08/08/11 1325

## 2011-10-01 ENCOUNTER — Encounter (HOSPITAL_COMMUNITY): Payer: Self-pay | Admitting: *Deleted

## 2011-10-01 DIAGNOSIS — Z87891 Personal history of nicotine dependence: Secondary | ICD-10-CM | POA: Insufficient documentation

## 2011-10-01 DIAGNOSIS — I4891 Unspecified atrial fibrillation: Secondary | ICD-10-CM | POA: Insufficient documentation

## 2011-10-01 DIAGNOSIS — Z79899 Other long term (current) drug therapy: Secondary | ICD-10-CM | POA: Insufficient documentation

## 2011-10-01 DIAGNOSIS — I10 Essential (primary) hypertension: Secondary | ICD-10-CM | POA: Insufficient documentation

## 2011-10-01 DIAGNOSIS — W108XXA Fall (on) (from) other stairs and steps, initial encounter: Secondary | ICD-10-CM | POA: Insufficient documentation

## 2011-10-01 DIAGNOSIS — S8010XA Contusion of unspecified lower leg, initial encounter: Secondary | ICD-10-CM | POA: Insufficient documentation

## 2011-10-01 DIAGNOSIS — Z7901 Long term (current) use of anticoagulants: Secondary | ICD-10-CM | POA: Insufficient documentation

## 2011-10-01 DIAGNOSIS — E669 Obesity, unspecified: Secondary | ICD-10-CM | POA: Insufficient documentation

## 2011-10-01 NOTE — ED Notes (Signed)
Pt missed a step and fell. Pt now has large knot on lower left leg.

## 2011-10-02 ENCOUNTER — Emergency Department (HOSPITAL_COMMUNITY): Payer: Medicare Other

## 2011-10-02 ENCOUNTER — Emergency Department (HOSPITAL_COMMUNITY)
Admission: EM | Admit: 2011-10-02 | Discharge: 2011-10-02 | Disposition: A | Discharge status: Home / Self Care | Payer: Medicare Other | Attending: Emergency Medicine | Admitting: Emergency Medicine

## 2011-10-02 DIAGNOSIS — T148XXA Other injury of unspecified body region, initial encounter: Secondary | ICD-10-CM

## 2011-10-02 LAB — PROTIME-INR: INR: 1.96 — ABNORMAL HIGH (ref 0.00–1.49)

## 2011-10-02 MED ORDER — ONDANSETRON HCL 4 MG/2ML IJ SOLN
4.0000 mg | Freq: Once | INTRAMUSCULAR | Status: AC
  Administered 2011-10-02: 4 mg via INTRAVENOUS
  Filled 2011-10-02: qty 2

## 2011-10-02 MED ORDER — MORPHINE SULFATE 4 MG/ML IJ SOLN
6.0000 mg | Freq: Once | INTRAMUSCULAR | Status: AC
  Administered 2011-10-02: 6 mg via INTRAVENOUS
  Filled 2011-10-02: qty 2

## 2011-10-02 MED ORDER — HYDROCODONE-ACETAMINOPHEN 5-325 MG PO TABS: 1.0000 | ORAL_TABLET | ORAL | Status: DC | PRN

## 2011-10-02 NOTE — ED Provider Notes (Signed)
History     CSN: [id number]  Arrival date & time 10/01/11  2336   First MD Initiated Contact with Patient 10/02/11 0044      Chief Complaint  Patient presents with  . Leg Pain  . Fall    (Consider location/radiation/quality/duration/timing/severity/associated sxs/prior treatment) The history is provided by the patient.  the patient reports slipping and falling and striking his right anterior shin on the stairs.  He reports immediate hematoma of his right anterior shin.  He reports moderate pain at this time.  He drove himself the emergency department and continued to ambulate on it.  He reported that his swelling came up so abruptly it concerned him.  He is on Coumadin for history of atrial fibrillation.  He does not know what his last INR was.  He has no other symptoms.  His pain is mild to moderate in severity.  Past Medical History  Diagnosis Date  . Obesity   . Hypertension   . Atrial fibrillation   . Cellulitis     Past Surgical History  Procedure Date  . Abdominal surgery     History reviewed. No pertinent family history.  History  Substance Use Topics  . Smoking status: Former Games developer  . Smokeless tobacco: Not on file  . Alcohol Use: Yes      Review of Systems  All other systems reviewed and are negative.    Allergies  Strawberry and Vancomycin  Home Medications   Current Outpatient Rx  Name Route Sig Dispense Refill  . ACETAMINOPHEN ER 650 MG PO TBCR Oral Take 1,300 mg by mouth daily. Pain    . DILTIAZEM HCL ER COATED BEADS 360 MG PO CP24 Oral Take 360 mg by mouth daily.      Marland Kitchen TAMSULOSIN HCL 0.4 MG PO CAPS Oral Take 0.4 mg by mouth daily after supper.      . WARFARIN SODIUM 5 MG PO TABS Oral Take 7.5-10 mg by mouth daily. Takes 7.5 mg on Mon, Thursday and on Tues, Wed, Fri, Sat, Sun patient takes 10 mg      BP 144/74  Pulse 75  Temp 97.5 F (36.4 C)  Resp 20  Ht 5\' 11"  (1.803 m)  Wt 442 lb (200.49 kg)  BMI 61.65 kg/m2  SpO2 97%  Physical  Exam  Nursing note and vitals reviewed. Constitutional: He is oriented to person, place, and time. He appears well-developed and well-nourished.  HENT:  Head: Normocephalic and atraumatic.  Eyes: EOM are normal.  Neck: Normal range of motion.  Cardiovascular: Normal rate, regular rhythm, normal heart sounds and intact distal pulses.   Pulmonary/Chest: Effort normal and breath sounds normal. No respiratory distress.  Abdominal: Soft. He exhibits no distension. There is no tenderness.  Musculoskeletal: Normal range of motion.       Hematoma of right anterior mid tibia.  There is mild surrounding tenderness.  Normal pulses distally.  This is approximately the size of a ping-pong ball.  Neurological: He is alert and oriented to person, place, and time.  Skin: Skin is warm and dry.  Psychiatric: He has a normal mood and affect. Judgment normal.    ED Course  Procedures (including critical care time)  Labs Reviewed  PROTIME-INR - Abnormal; Notable for the following:    Prothrombin Time 21.6 (*)     INR 1.96 (*)     All other components within normal limits   Dg Tibia/fibula Right  10/02/2011  *RADIOLOGY REPORT*  Clinical Data: Right leg swelling  status post fall.  RIGHT TIBIA AND FIBULA - 2 VIEW  Comparison: None.  Findings: Limited examination secondary to patient body habitus. No displaced fracture is visualized.  Advanced degenerative changes of the knee joint are partially imaged.  Dedicated joint radiograph should be considered if there is concern for acute joint pathology. Posterior calcaneal enthesopathic change.  IMPRESSION: Degraded by patient body habitus. No displaced fracture identified.   Original Report Authenticated By: Waneta Martins, M.D.     I personally reviewed the imaging tests through PACS system  I reviewed available ER/hospitalization records thought the EMR   1. Hematoma       MDM  Patient can ambulate on his leg.  His INR is 1.96.  X-ray without  evidence of displaced fracture.  Discharge home with PCP followup.        Lyanne Co, MD 10/02/11 [phone]

## 2011-10-02 NOTE — ED Notes (Signed)
Patient states he was walking down some steps and missed a step, states he thinks he may have struck his leg on the step but is not sure.  States he does take blood thinners at home.  States the accident occurred around 9pm tonight and the deformity present on his leg just kept getting larger, so he decided to come to the ER.  States he is able to bear weight on his right leg.

## 2011-10-02 NOTE — ED Notes (Signed)
Ice continued to right lower extremity, some decrease in swelling noted.  Pt resting quietly, will continue to monitor.

## 2012-03-11 ENCOUNTER — Emergency Department (HOSPITAL_COMMUNITY)
Admission: EM | Admit: 2012-03-11 | Discharge: 2012-03-11 | Disposition: A | Discharge status: Home / Self Care | Payer: Medicare Other | Attending: Emergency Medicine | Admitting: Emergency Medicine

## 2012-03-11 ENCOUNTER — Emergency Department (HOSPITAL_COMMUNITY): Payer: Medicare Other

## 2012-03-11 ENCOUNTER — Encounter (HOSPITAL_COMMUNITY): Payer: Self-pay | Admitting: *Deleted

## 2012-03-11 DIAGNOSIS — Z87891 Personal history of nicotine dependence: Secondary | ICD-10-CM | POA: Insufficient documentation

## 2012-03-11 DIAGNOSIS — Z872 Personal history of diseases of the skin and subcutaneous tissue: Secondary | ICD-10-CM | POA: Insufficient documentation

## 2012-03-11 DIAGNOSIS — Z79899 Other long term (current) drug therapy: Secondary | ICD-10-CM | POA: Insufficient documentation

## 2012-03-11 DIAGNOSIS — Z8679 Personal history of other diseases of the circulatory system: Secondary | ICD-10-CM | POA: Insufficient documentation

## 2012-03-11 DIAGNOSIS — J189 Pneumonia, unspecified organism: Secondary | ICD-10-CM | POA: Insufficient documentation

## 2012-03-11 DIAGNOSIS — E669 Obesity, unspecified: Secondary | ICD-10-CM | POA: Insufficient documentation

## 2012-03-11 DIAGNOSIS — I10 Essential (primary) hypertension: Secondary | ICD-10-CM | POA: Insufficient documentation

## 2012-03-11 DIAGNOSIS — R05 Cough: Secondary | ICD-10-CM | POA: Insufficient documentation

## 2012-03-11 DIAGNOSIS — R059 Cough, unspecified: Secondary | ICD-10-CM | POA: Insufficient documentation

## 2012-03-11 LAB — CBC WITH DIFFERENTIAL/PLATELET
Eosinophils Absolute: 0.1 10*3/uL (ref 0.0–0.7)
Lymphocytes Relative: 22 % (ref 12–46)
Lymphs Abs: 1.3 10*3/uL (ref 0.7–4.0)
MCH: 30.2 pg (ref 26.0–34.0)
Neutrophils Relative %: 60 % (ref 43–77)
Platelets: 152 10*3/uL (ref 150–400)
RBC: 5.39 MIL/uL (ref 4.22–5.81)
WBC: 5.8 10*3/uL (ref 4.0–10.5)

## 2012-03-11 LAB — COMPREHENSIVE METABOLIC PANEL
ALT: 14 U/L (ref 0–53)
AST: 20 U/L (ref 0–37)
Albumin: 3.5 g/dL (ref 3.5–5.2)
Alkaline Phosphatase: 64 U/L (ref 39–117)
GFR calc Af Amer: 89 mL/min — ABNORMAL LOW (ref >90)
Glucose, Bld: 130 mg/dL — ABNORMAL HIGH (ref 70–99)
Potassium: 4 mEq/L (ref 3.5–5.1)
Sodium: 133 mEq/L — ABNORMAL LOW (ref 135–145)
Total Protein: 7 g/dL (ref 6.0–8.3)

## 2012-03-11 LAB — PRO B NATRIURETIC PEPTIDE: Pro B Natriuretic peptide (BNP): 2085 pg/mL — ABNORMAL HIGH (ref 0–125)

## 2012-03-11 MED ORDER — IPRATROPIUM BROMIDE 0.02 % IN SOLN
0.5000 mg | Freq: Once | RESPIRATORY_TRACT | Status: AC
  Administered 2012-03-11: 0.5 mg via RESPIRATORY_TRACT
  Filled 2012-03-11: qty 2.5

## 2012-03-11 MED ORDER — ALBUTEROL SULFATE (5 MG/ML) 0.5% IN NEBU
5.0000 mg | INHALATION_SOLUTION | Freq: Once | RESPIRATORY_TRACT | Status: AC
  Administered 2012-03-11: 5 mg via RESPIRATORY_TRACT
  Filled 2012-03-11: qty 1

## 2012-03-11 MED ORDER — ACETAMINOPHEN 325 MG PO TABS
650.0000 mg | ORAL_TABLET | Freq: Once | ORAL | Status: AC
  Administered 2012-03-11: 650 mg via ORAL
  Filled 2012-03-11: qty 2

## 2012-03-11 MED ORDER — HYDROCOD POLST-CHLORPHEN POLST 10-8 MG/5ML PO LQCR: 5.0000 mL | Freq: Two times a day (BID) | ORAL | Status: DC | PRN

## 2012-03-11 MED ORDER — AZITHROMYCIN 250 MG PO TABS: 250.0000 mg | ORAL_TABLET | Freq: Every day | ORAL | Status: DC

## 2012-03-11 NOTE — ED Notes (Signed)
Pt requesting meds for headache.  

## 2012-03-11 NOTE — ED Provider Notes (Signed)
History    This chart was scribed for Geoffery Lyons, MD by Charolett Bumpers, ED Scribe. The patient was seen in room APA04/APA04. Patient's care was started at 1423.   CSN: [id number]  Arrival date & time 03/11/12  1337   First MD Initiated Contact with Patient 03/11/12 1423      Chief Complaint  Patient presents with  . Shortness of Breath    The history is provided by the patient. No language interpreter was used.   [name] is a 62 y.o. male who presents to the Emergency Department complaining of persistent, moderate SOB that started yesterday. He reports that he has a productive cough that started 4 days ago. He denies any increased leg swelling, fevers or chest pain. He has a h/o HTN and a-fib. He denies any h/o asthma, COPD, emphysema, MI, CHF. He denies any cardiac stents.   Past Medical History  Diagnosis Date  . Obesity   . Hypertension   . Atrial fibrillation   . Cellulitis     Past Surgical History  Procedure Laterality Date  . Abdominal surgery      History reviewed. No pertinent family history.  History  Substance Use Topics  . Smoking status: Former Games developer  . Smokeless tobacco: Not on file  . Alcohol Use: Yes      Review of Systems  Respiratory: Positive for cough and shortness of breath.   Cardiovascular: Negative for chest pain and palpitations.  All other systems reviewed and are negative.    Allergies  Strawberry and Vancomycin  Home Medications   Current Outpatient Rx  Name  Route  Sig  Dispense  Refill  . acetaminophen (TYLENOL) 650 MG CR tablet   Oral   Take 1,300 mg by mouth daily. Pain         . diltiazem (CARDIZEM CD) 360 MG 24 hr capsule   Oral   Take 360 mg by mouth daily.           Marland Kitchen HYDROcodone-acetaminophen (NORCO/VICODIN) 5-325 MG per tablet   Oral   Take 1 tablet by mouth every 4 (four) hours as needed for pain.   8 tablet   0   . Tamsulosin HCl (FLOMAX) 0.4 MG CAPS   Oral   Take 0.4 mg by mouth  daily after supper.           . warfarin (COUMADIN) 5 MG tablet   Oral   Take 7.5-10 mg by mouth daily. Takes 7.5 mg on Mon, Thursday and on Tues, Wed, Fri, Sat, Sun patient takes 10 mg           BP 149/70  Pulse 88  Temp(Src) 97.9 F (36.6 C) (Oral)  Resp 28  Ht 5' 11.5" (1.816 m)  Wt 482 lb 2 oz (218.69 kg)  BMI 66.31 kg/m2  SpO2 93%  Physical Exam  Nursing note and vitals reviewed. Constitutional: He is oriented to person, place, and time. He appears well-developed and well-nourished. No distress.  HENT:  Head: Normocephalic and atraumatic.  Eyes: EOM are normal. Pupils are equal, round, and reactive to light.  Neck: Normal range of motion. Neck supple. No tracheal deviation present.  Cardiovascular: Normal rate and normal heart sounds.  An irregularly irregular rhythm present.  Pulmonary/Chest: Effort normal and breath sounds normal. No respiratory distress.  Abdominal: Soft. He exhibits no distension.  Morbidly obese.   Musculoskeletal: Normal range of motion. He exhibits no edema.  No edema of lower legs. Right leg  is noted to have swelling of posterior calf and achilles region. This is chronic and been there for years.   Neurological: He is alert and oriented to person, place, and time.  Skin: Skin is warm and dry.  Psychiatric: He has a normal mood and affect. His behavior is normal.    ED Course  Procedures (including critical care time)  DIAGNOSTIC STUDIES: Oxygen Saturation is 93% on room air, adequate by my interpretation.    COORDINATION OF CARE:  14:45-Discussed planned course of treatment with the patient including a breathing treatment, chest x-ray, who is agreeable at this time.    Labs Reviewed - No data to display No results found.   No diagnosis found.   Date: 03/11/2012  Rate: 111  Rhythm: atrial fibrillation  QRS Axis: left  Intervals: normal  ST/T Wave abnormalities: normal  Conduction Disutrbances:none  Narrative Interpretation:    Old EKG Reviewed: unchanged    MDM  The workup reveals pneumonia on the chest xray.  Although the bnp is elevated, there is no evidence of chf or cardiac ischemia.  He is in afib, but this is his baseline.  The oxygen saturations are within acceptable levels and he appears clinically well.  Will discharge to home with antibiotics.      I personally performed the services described in this documentation, which was scribed in my presence. The recorded information has been reviewed and is accurate.        Geoffery Lyons, MD 03/11/12 [phone]

## 2012-03-11 NOTE — ED Notes (Signed)
Cough, sob since Friday.  Alert,

## 2012-04-17 ENCOUNTER — Encounter: Payer: Self-pay | Admitting: Pharmacist Clinician (PhC)/ Clinical Pharmacy Specialist

## 2012-04-17 DIAGNOSIS — I482 Chronic atrial fibrillation, unspecified: Secondary | ICD-10-CM | POA: Insufficient documentation

## 2012-04-17 DIAGNOSIS — Z7901 Long term (current) use of anticoagulants: Secondary | ICD-10-CM

## 2012-04-17 DIAGNOSIS — I4891 Unspecified atrial fibrillation: Secondary | ICD-10-CM

## 2012-07-11 ENCOUNTER — Emergency Department (HOSPITAL_COMMUNITY)
Admission: EM | Admit: 2012-07-11 | Discharge: 2012-07-11 | Disposition: A | Discharge status: Home / Self Care | Payer: Medicare Other | Attending: Emergency Medicine | Admitting: Emergency Medicine

## 2012-07-11 ENCOUNTER — Encounter (HOSPITAL_COMMUNITY): Payer: Self-pay | Admitting: Emergency Medicine

## 2012-07-11 DIAGNOSIS — Z87891 Personal history of nicotine dependence: Secondary | ICD-10-CM | POA: Insufficient documentation

## 2012-07-11 DIAGNOSIS — Z7901 Long term (current) use of anticoagulants: Secondary | ICD-10-CM | POA: Insufficient documentation

## 2012-07-11 DIAGNOSIS — L0291 Cutaneous abscess, unspecified: Secondary | ICD-10-CM | POA: Insufficient documentation

## 2012-07-11 DIAGNOSIS — Z872 Personal history of diseases of the skin and subcutaneous tissue: Secondary | ICD-10-CM | POA: Insufficient documentation

## 2012-07-11 DIAGNOSIS — Z8639 Personal history of other endocrine, nutritional and metabolic disease: Secondary | ICD-10-CM | POA: Insufficient documentation

## 2012-07-11 DIAGNOSIS — Z79899 Other long term (current) drug therapy: Secondary | ICD-10-CM | POA: Insufficient documentation

## 2012-07-11 DIAGNOSIS — L0390 Cellulitis, unspecified: Secondary | ICD-10-CM

## 2012-07-11 DIAGNOSIS — E669 Obesity, unspecified: Secondary | ICD-10-CM | POA: Insufficient documentation

## 2012-07-11 DIAGNOSIS — L539 Erythematous condition, unspecified: Secondary | ICD-10-CM | POA: Insufficient documentation

## 2012-07-11 DIAGNOSIS — I10 Essential (primary) hypertension: Secondary | ICD-10-CM | POA: Insufficient documentation

## 2012-07-11 DIAGNOSIS — I4891 Unspecified atrial fibrillation: Secondary | ICD-10-CM | POA: Insufficient documentation

## 2012-07-11 DIAGNOSIS — Z862 Personal history of diseases of the blood and blood-forming organs and certain disorders involving the immune mechanism: Secondary | ICD-10-CM | POA: Insufficient documentation

## 2012-07-11 DIAGNOSIS — Z86718 Personal history of other venous thrombosis and embolism: Secondary | ICD-10-CM | POA: Insufficient documentation

## 2012-07-11 LAB — CBC WITH DIFFERENTIAL/PLATELET
Basophils Relative: 0 % (ref 0–1)
HCT: 45.5 % (ref 39.0–52.0)
Hemoglobin: 15.7 g/dL (ref 13.0–17.0)
MCHC: 34.5 g/dL (ref 30.0–36.0)
Monocytes Absolute: 0.8 10*3/uL (ref 0.1–1.0)
Monocytes Relative: 9 % (ref 3–12)
Neutro Abs: 6.6 10*3/uL (ref 1.7–7.7)

## 2012-07-11 LAB — BASIC METABOLIC PANEL
BUN: 15 mg/dL (ref 6–23)
CO2: 31 mEq/L (ref 19–32)
Chloride: 103 mEq/L (ref 96–112)
Creatinine, Ser: 0.74 mg/dL (ref 0.50–1.35)
GFR calc Af Amer: >90 mL/min (ref >90)

## 2012-07-11 LAB — URINALYSIS, ROUTINE W REFLEX MICROSCOPIC
Bilirubin Urine: NEGATIVE
Glucose, UA: NEGATIVE mg/dL
Ketones, ur: NEGATIVE mg/dL
pH: 7 (ref 5.0–8.0)

## 2012-07-11 LAB — URINE MICROSCOPIC-ADD ON

## 2012-07-11 MED ORDER — CLINDAMYCIN PHOSPHATE 900 MG/50ML IV SOLN: 900.0000 mg | Freq: Once | INTRAVENOUS | Status: AC

## 2012-07-11 MED ORDER — HYDROCODONE-ACETAMINOPHEN 5-325 MG PO TABS
2.0000 | ORAL_TABLET | Freq: Once | ORAL | Status: AC
  Administered 2012-07-11: 2 via ORAL
  Filled 2012-07-11: qty 2

## 2012-07-11 MED ORDER — HYDROCODONE-ACETAMINOPHEN 5-325 MG PO TABS: 2.0000 | ORAL_TABLET | ORAL | Status: DC | PRN

## 2012-07-11 MED ORDER — DEXTROSE 5 % IV SOLN: 900.0000 mg | Freq: Once | INTRAVENOUS | Status: DC

## 2012-07-11 MED ORDER — CLINDAMYCIN PHOSPHATE 900 MG/50ML IV SOLN
INTRAVENOUS | Status: AC
  Administered 2012-07-11: 900 mg via INTRAVENOUS
  Filled 2012-07-11: qty 50

## 2012-07-11 MED ORDER — CLINDAMYCIN HCL 150 MG PO CAPS: 300.0000 mg | ORAL_CAPSULE | Freq: Four times a day (QID) | ORAL | Status: DC

## 2012-07-11 NOTE — ED Provider Notes (Signed)
History    CSN: [id number] Arrival date & time 07/11/12  1031  First MD Initiated Contact with Patient 07/11/12 1249     Chief Complaint  Patient presents with  . Leg Pain   (Consider location/radiation/quality/duration/timing/severity/associated sxs/prior Treatment) Patient is a 62 y.o. male presenting with leg pain.  Leg Pain Location:  Leg Time since incident:  2 days Injury: no   Leg location:  R leg Pain details:    Quality:  Burning   Radiates to:  Does not radiate   Severity:  Moderate   Onset quality:  Gradual   Duration:  2 days   Timing:  Constant   Progression:  Worsening Chronicity:  New Relieved by:  Nothing Exacerbated by: palpation, movement. Ineffective treatments:  None tried Associated symptoms: no decreased ROM, no fever, no numbness, no swelling and no tingling    Past Medical History  Diagnosis Date  . Obesity   . Hypertension   . Atrial fibrillation   . Cellulitis   . Hyperlipidemia   . Deep vein thrombosis     hx of   Past Surgical History  Procedure Laterality Date  . Abdominal surgery     Family History  Problem Relation Age of Onset  . Cancer Mother    History  Substance Use Topics  . Smoking status: Former Games developer  . Smokeless tobacco: Not on file     Comment: quit 2012  . Alcohol Use: Yes     Comment: occasional    Review of Systems  Constitutional: Negative for fever.  HENT: Negative for congestion.   Respiratory: Negative for cough and shortness of breath.   Cardiovascular: Negative for chest pain.  Gastrointestinal: Negative for nausea, vomiting, abdominal pain and diarrhea.  All other systems reviewed and are negative.    Allergies  Strawberry; Tetracyclines & related; and Vancomycin  Home Medications   Current Outpatient Rx  Name  Route  Sig  Dispense  Refill  . diltiazem (CARDIZEM CD) 360 MG 24 hr capsule   Oral   Take 360 mg by mouth daily.           Marland Kitchen ibuprofen (ADVIL,MOTRIN) 200 MG tablet   Oral   Take 600 mg by mouth every 6 (six) hours as needed for pain.         Marland Kitchen oxyCODONE-acetaminophen (PERCOCET/ROXICET) 5-325 MG per tablet   Oral   Take 1 tablet by mouth daily.         . Tamsulosin HCl (FLOMAX) 0.4 MG CAPS   Oral   Take 0.4 mg by mouth daily after supper.           . warfarin (COUMADIN) 5 MG tablet   Oral   Take 7.5-10 mg by mouth daily. Takes 7.5 mg on Monday and Thursday. On Tues, Wed, Fri, Sat, Sun patient takes 10 mg          BP 101/87  Pulse 58  Temp(Src) 97.6 F (36.4 C) (Oral)  Resp 19  SpO2 95% Physical Exam  Nursing note and vitals reviewed. Constitutional: He is oriented to person, place, and time. He appears well-developed and well-nourished. No distress.  HENT:  Head: Normocephalic and atraumatic.  Mouth/Throat: Oropharynx is clear and moist.  Eyes: Conjunctivae are normal. Pupils are equal, round, and reactive to light. No scleral icterus.  Neck: Neck supple.  Cardiovascular: Normal rate, regular rhythm, normal heart sounds and intact distal pulses.   No murmur heard. Pulmonary/Chest: Effort normal and breath sounds normal. No  stridor. No respiratory distress. He has no wheezes. He has no rales.  Abdominal: Soft. He exhibits no distension. There is no tenderness.  Genitourinary: Right testis shows no mass, no swelling and no tenderness. Left testis shows no mass, no swelling and no tenderness.  Musculoskeletal: Normal range of motion. He exhibits no edema.  Neurological: He is alert and oriented to person, place, and time.  Skin: Skin is warm and dry. There is erythema (right inner thigh area).  Psychiatric: He has a normal mood and affect. His behavior is normal.    ED Course  Procedures (including critical care time) Labs Reviewed  BASIC METABOLIC PANEL - Abnormal; Notable for the following:    Glucose, Bld 139 (*)    All other components within normal limits  URINALYSIS, ROUTINE W REFLEX MICROSCOPIC - Abnormal; Notable for the  following:    Hgb urine dipstick TRACE (*)    All other components within normal limits  PROTIME-INR - Abnormal; Notable for the following:    Prothrombin Time 34.7 (*)    INR 3.62 (*)    All other components within normal limits  CBC WITH DIFFERENTIAL  URINE MICROSCOPIC-ADD ON   No results found. 1. Cellulitis     MDM  62 year old male with a history of severe obesity presenting with pain and redness of his right thigh which started yesterday morning. No fevers and is generally well appearing. His right inner thigh is erythematous and tender, but without discrete fluid collections or palpable masses. Bedside ultrasound was used and show any fluid collections. His exam is consistent with cellulitis, and he was given a dose of IV clindamycin. He does not show any signs of Fournier's gangrene on GU exam. There is no crepitus or signs of necrotizing infection.  2:44 PM blood work unremarkable.  No leukocytosis.  Pt remains nontoxic and well appearing.  INR mildly supratherapeutic, pt told of this value and advised to follow up.  Feel that he is appropriate for outpatient treatment for cellulitis.  He was also verbally advised to follow up with his PCP closely.  Return precautions given.    Candyce Churn, MD 07/11/12 [phone]

## 2012-07-11 NOTE — ED Notes (Signed)
Pt with redness to right upper/inner thigh for last couple of days, hx of cellulitis  in past, pt denies fevers or N/V

## 2012-07-11 NOTE — ED Notes (Signed)
Pt c/o right leg pain/erythema/edema x 2 days.

## 2012-08-05 ENCOUNTER — Telehealth: Payer: Self-pay | Admitting: Internal Medicine

## 2012-08-05 NOTE — Telephone Encounter (Signed)
Pt states he spoke to someone about getting a refill on his Coumadin on Friday.  [facility] called?  He is out of Coumadin now. He will have [facility] call us again now.

## 2012-08-05 NOTE — Telephone Encounter (Signed)
Need refill on Warfarin 5mg  #180

## 2012-08-05 NOTE — Telephone Encounter (Signed)
Returned call to pt.  Informed message received r/t refill on Coumadin and cannot be refilled as records show last OV was in July 2012.  Pt stated he was seen at least twice last year and has been getting refills.  Pt informed RN will call [place] office to find out if any records available to support refilling Coumadin.  Pt verbalized understanding and agreed w/ plan.  Kennon Rounds, [facility] notified and stated last visit was in 2012.    Call to [facility] and informed last OV was Dec. 2013.  Faxed note.  Kennon Rounds, [facility] stated pt will need to be seen before refill.  Call to pt and informed.  Pt verbalized understanding and agreed w/ plan.  Appt scheduled for Thursday, 8.7.14 at 11:10am in [facility].

## 2012-08-07 ENCOUNTER — Ambulatory Visit (INDEPENDENT_AMBULATORY_CARE_PROVIDER_SITE_OTHER): Payer: Self-pay | Admitting: Pharmacist

## 2012-08-07 ENCOUNTER — Encounter (HOSPITAL_COMMUNITY): Payer: Self-pay

## 2012-08-07 ENCOUNTER — Emergency Department (HOSPITAL_COMMUNITY)
Admission: EM | Admit: 2012-08-07 | Discharge: 2012-08-07 | Disposition: A | Discharge status: Home / Self Care | Payer: Medicare Other | Attending: Emergency Medicine | Admitting: Emergency Medicine

## 2012-08-07 ENCOUNTER — Telehealth: Payer: Self-pay | Admitting: Internal Medicine

## 2012-08-07 DIAGNOSIS — I10 Essential (primary) hypertension: Secondary | ICD-10-CM | POA: Insufficient documentation

## 2012-08-07 DIAGNOSIS — Z79899 Other long term (current) drug therapy: Secondary | ICD-10-CM | POA: Insufficient documentation

## 2012-08-07 DIAGNOSIS — L0390 Cellulitis, unspecified: Secondary | ICD-10-CM

## 2012-08-07 DIAGNOSIS — Z7901 Long term (current) use of anticoagulants: Secondary | ICD-10-CM | POA: Insufficient documentation

## 2012-08-07 DIAGNOSIS — R229 Localized swelling, mass and lump, unspecified: Secondary | ICD-10-CM | POA: Insufficient documentation

## 2012-08-07 DIAGNOSIS — Z8639 Personal history of other endocrine, nutritional and metabolic disease: Secondary | ICD-10-CM | POA: Insufficient documentation

## 2012-08-07 DIAGNOSIS — L02419 Cutaneous abscess of limb, unspecified: Secondary | ICD-10-CM | POA: Insufficient documentation

## 2012-08-07 DIAGNOSIS — R21 Rash and other nonspecific skin eruption: Secondary | ICD-10-CM | POA: Insufficient documentation

## 2012-08-07 DIAGNOSIS — Z86718 Personal history of other venous thrombosis and embolism: Secondary | ICD-10-CM | POA: Insufficient documentation

## 2012-08-07 DIAGNOSIS — I4891 Unspecified atrial fibrillation: Secondary | ICD-10-CM

## 2012-08-07 DIAGNOSIS — Z87891 Personal history of nicotine dependence: Secondary | ICD-10-CM | POA: Insufficient documentation

## 2012-08-07 DIAGNOSIS — Z862 Personal history of diseases of the blood and blood-forming organs and certain disorders involving the immune mechanism: Secondary | ICD-10-CM | POA: Insufficient documentation

## 2012-08-07 DIAGNOSIS — E669 Obesity, unspecified: Secondary | ICD-10-CM | POA: Insufficient documentation

## 2012-08-07 LAB — D-DIMER, QUANTITATIVE: D-Dimer, Quant: <0.27 ug/mL-FEU (ref 0.00–0.48)

## 2012-08-07 LAB — PROTIME-INR
INR: 3.1 — ABNORMAL HIGH (ref 0.00–1.49)
Prothrombin Time: 30.8 seconds — ABNORMAL HIGH (ref 11.6–15.2)

## 2012-08-07 LAB — BASIC METABOLIC PANEL
CO2: 28 mEq/L (ref 19–32)
GFR calc non Af Amer: 88 mL/min — ABNORMAL LOW (ref >90)
Glucose, Bld: 132 mg/dL — ABNORMAL HIGH (ref 70–99)
Potassium: 3.8 mEq/L (ref 3.5–5.1)
Sodium: 139 mEq/L (ref 135–145)

## 2012-08-07 LAB — CBC WITH DIFFERENTIAL/PLATELET
Basophils Absolute: 0 10*3/uL (ref 0.0–0.1)
Lymphocytes Relative: 20 % (ref 12–46)
Lymphs Abs: 1.4 10*3/uL (ref 0.7–4.0)
Neutrophils Relative %: 63 % (ref 43–77)
Platelets: 158 10*3/uL (ref 150–400)
RBC: 4.84 MIL/uL (ref 4.22–5.81)
WBC: 7.1 10*3/uL (ref 4.0–10.5)

## 2012-08-07 MED ORDER — CLINDAMYCIN PHOSPHATE 900 MG/50ML IV SOLN
900.0000 mg | Freq: Once | INTRAVENOUS | Status: AC
  Administered 2012-08-07: 900 mg via INTRAVENOUS
  Filled 2012-08-07: qty 50

## 2012-08-07 MED ORDER — CLINDAMYCIN HCL 150 MG PO CAPS: 300.0000 mg | ORAL_CAPSULE | Freq: Four times a day (QID) | ORAL | Status: DC

## 2012-08-07 MED ORDER — HYDROMORPHONE HCL PF 1 MG/ML IJ SOLN
1.0000 mg | Freq: Once | INTRAMUSCULAR | Status: AC
  Administered 2012-08-07: 1 mg via INTRAVENOUS
  Filled 2012-08-07: qty 1

## 2012-08-07 MED ORDER — WARFARIN SODIUM 5 MG PO TABS: 7.5000 mg | ORAL_TABLET | Freq: Every day | ORAL | Status: DC

## 2012-08-07 MED ORDER — ONDANSETRON HCL 4 MG/2ML IJ SOLN
4.0000 mg | Freq: Once | INTRAMUSCULAR | Status: AC
  Administered 2012-08-07: 4 mg via INTRAVENOUS
  Filled 2012-08-07: qty 2

## 2012-08-07 NOTE — Telephone Encounter (Signed)
Spoke with pt.  Explained last time I can find that he saw Belenda Cruise was 2012- but I do not have access to his paper chart.  That is based on [facility] records.  He did go to ER today for cellulitis.  INR was checked.  Will use that to dose.  Rx sent in for 30 days and appt made to see Belenda Cruise in 2 weeks.  He is aware he will have to keep his appts to get any refills.

## 2012-08-07 NOTE — ED Notes (Addendum)
Pt reports pain, redness, and swelling to r lower leg x 2 days.  Reports had cellulitis in r thigh approx 1 month ago.  r lower leg much larger than left.  PT says is always more swollen since a spider bit him 20 years ago.  Pedal pulses palpable.  Pt also requests to have INR checked because is supposed to go to cardiologist tomorrow to have coumadin adjusted.  Pt says doesn't feel like going to [place].

## 2012-08-07 NOTE — ED Provider Notes (Deleted)
CSN: [id number]     Arrival date & time 08/07/12  0914 History     First MD Initiated Contact with Patient 08/07/12 [phone]     Chief Complaint  Patient presents with  . Leg Pain   (Consider location/radiation/quality/duration/timing/severity/associated sxs/prior Treatment) HPI  Past Medical History  Diagnosis Date  . Obesity   . Hypertension   . Atrial fibrillation   . Cellulitis   . Hyperlipidemia   . Deep vein thrombosis     hx of   Past Surgical History  Procedure Laterality Date  . Abdominal surgery     Family History  Problem Relation Age of Onset  . Cancer Mother    History  Substance Use Topics  . Smoking status: Former Games developer  . Smokeless tobacco: Not on file     Comment: quit 2012  . Alcohol Use: Yes     Comment: occasional    Review of Systems  Allergies  Strawberry; Tetracyclines & related; and Vancomycin  Home Medications   Current Outpatient Rx  Name  Route  Sig  Dispense  Refill  . diltiazem (CARDIZEM CD) 360 MG 24 hr capsule   Oral   Take 360 mg by mouth daily.           Marland Kitchen ibuprofen (ADVIL,MOTRIN) 200 MG tablet   Oral   Take 600 mg by mouth every 6 (six) hours as needed for pain.         Marland Kitchen oxyCODONE-acetaminophen (PERCOCET/ROXICET) 5-325 MG per tablet   Oral   Take 1 tablet by mouth daily.         . Tamsulosin HCl (FLOMAX) 0.4 MG CAPS   Oral   Take 0.4 mg by mouth daily after supper.           . warfarin (COUMADIN) 5 MG tablet   Oral   Take 7.5-10 mg by mouth daily. Takes 7.5 mg on Monday and Thursday. On Tues, Wed, Fri, Sat, Sun patient takes 10 mg          BP 117/56  Temp(Src) 98.2 F (36.8 C) (Oral)  Resp 20  Ht 5\' 11"  (1.803 m)  Wt 482 lb (218.634 kg)  BMI 67.26 kg/m2  SpO2 96% Physical Exam  ED Course   Procedures (including critical care time)  Labs Reviewed  CBC WITH DIFFERENTIAL - Abnormal; Notable for the following:    Eosinophils Relative 8 (*)    All other components within normal limits  BASIC  METABOLIC PANEL - Abnormal; Notable for the following:    Glucose, Bld 132 (*)    BUN 24 (*)    GFR calc non Af Amer 88 (*)    All other components within normal limits  PROTIME-INR - Abnormal; Notable for the following:    Prothrombin Time 30.8 (*)    INR 3.10 (*)    All other components within normal limits  CULTURE, BLOOD (ROUTINE X 2)  CULTURE, BLOOD (ROUTINE X 2)  D-DIMER, QUANTITATIVE   No results found.  Diagnosis: Cellulitis  MDM  Patient presents to the ER for evaluation of pain and swelling with redness of the right calf area. Patient reports a previous history of cellulitis in the leg. Patient reports symptoms have been present for 2 days. He does have some induration, erythema and increased warmth over the calf area posteriorly. No fluctuance noted to suggest an abscess. Patient's blood work is unremarkable. This includes a normal d-dimer, examination is more consistent with infection than DVT. Patient reports that he  was treated recently for a similar infection outpatient with clindamycin and did well. He'll be initiated on clindamycin, return to the ER if his symptoms worsen.  Gilda Crease, MD 08/07/12 [phone]

## 2012-08-07 NOTE — Telephone Encounter (Signed)
Would like to know what his coumadin is and call his coumadin in . Cant make his appointment because he has cellulitis .Marland Kitchen Please call..   Thanks

## 2012-08-07 NOTE — ED Provider Notes (Addendum)
CSN: [id number]     Arrival date & time 08/07/12  0914 History    This chart was scribed for Gilda Crease, MD,  by Ashley Jacobs, ED Scribe. The patient was seen in room APA01/APA01 and the patient's care was started at 9:41 AM   First MD Initiated Contact with Patient 08/07/12 [phone]     Chief Complaint  Patient presents with  . Leg Pain   (Consider location/radiation/quality/duration/timing/severity/associated sxs/prior Treatment) HPI HPI Comments: [name] is a 62 y.o. male who presents to the Emergency Department complaining of moderate right leg pain.Pt has associated symptoms of redness, irritation and swelling.  Pt reports that symptoms are similar to previous episode of cellulitis in right thigh that was treated last month. Pt mentions that he has hx of atrial fibrillation. Pt also mention currently taking Coumadin to prevent clot   Past Medical History  Diagnosis Date  . Obesity   . Hypertension   . Atrial fibrillation   . Cellulitis   . Hyperlipidemia   . Deep vein thrombosis     hx of   Past Surgical History  Procedure Laterality Date  . Abdominal surgery     Family History  Problem Relation Age of Onset  . Cancer Mother    History  Substance Use Topics  . Smoking status: Former Games developer  . Smokeless tobacco: Not on file     Comment: quit 2012  . Alcohol Use: Yes     Comment: occasional    Review of Systems  Allergies  Strawberry; Tetracyclines & related; and Vancomycin  Home Medications   Current Outpatient Rx  Name  Route  Sig  Dispense  Refill  . clindamycin (CLEOCIN) 150 MG capsule   Oral   Take 2 capsules (300 mg total) by mouth 4 (four) times daily.   80 capsule   0   . diltiazem (CARDIZEM CD) 360 MG 24 hr capsule   Oral   Take 360 mg by mouth daily.           Marland Kitchen HYDROcodone-acetaminophen (NORCO/VICODIN) 5-325 MG per tablet   Oral   Take 2 tablets by mouth every 4 (four) hours as needed for pain.   10 tablet   0    . ibuprofen (ADVIL,MOTRIN) 200 MG tablet   Oral   Take 600 mg by mouth every 6 (six) hours as needed for pain.         Marland Kitchen oxyCODONE-acetaminophen (PERCOCET/ROXICET) 5-325 MG per tablet   Oral   Take 1 tablet by mouth daily.         . Tamsulosin HCl (FLOMAX) 0.4 MG CAPS   Oral   Take 0.4 mg by mouth daily after supper.           . warfarin (COUMADIN) 5 MG tablet   Oral   Take 7.5-10 mg by mouth daily. Takes 7.5 mg on Monday and Thursday. On Tues, Wed, Fri, Sat, Sun patient takes 10 mg          BP 117/56  Temp(Src) 98.2 F (36.8 C) (Oral)  Resp 20  Ht 5\' 11"  (1.803 m)  Wt 482 lb (218.634 kg)  BMI 67.26 kg/m2  SpO2 96% Physical Exam  Constitutional: He is oriented to person, place, and time. He appears well-developed and well-nourished. No distress.  HENT:  Head: Normocephalic and atraumatic.  Right Ear: Hearing normal.  Left Ear: Hearing normal.  Nose: Nose normal.  Mouth/Throat: Oropharynx is clear and moist and mucous membranes are normal.  Eyes: Conjunctivae and EOM are normal. Pupils are equal, round, and reactive to light.  Neck: Normal range of motion. Neck supple.  Cardiovascular: Regular rhythm, S1 normal and S2 normal.  Exam reveals no gallop and no friction rub.   No murmur heard. Pulmonary/Chest: Effort normal and breath sounds normal. No respiratory distress. He exhibits no tenderness.  Abdominal: Soft. Normal appearance and bowel sounds are normal. There is no hepatosplenomegaly. There is no tenderness. There is no rebound, no guarding, no tenderness at McBurney's point and negative Murphy's sign. No hernia.  Musculoskeletal: Normal range of motion.  Neurological: He is alert and oriented to person, place, and time. He has normal strength. No cranial nerve deficit or sensory deficit. Coordination normal. GCS eye subscore is 4. GCS verbal subscore is 5. GCS motor subscore is 6.  Skin: Skin is warm, dry and intact. No rash noted. No cyanosis.      Psychiatric: He has a normal mood and affect. His speech is normal and behavior is normal. Thought content normal.    ED Course  DIAGNOSTIC STUDIES: Oxygen Saturation is 96% on room air adequate by my interpretation.    COORDINATION OF CARE: 9:45 Discussed course of care with pt which includes . Pt understands and agrees.   Procedures (including critical care time)  Labs Reviewed  CBC WITH DIFFERENTIAL - Abnormal; Notable for the following:    Eosinophils Relative 8 (*)    All other components within normal limits  BASIC METABOLIC PANEL - Abnormal; Notable for the following:    Glucose, Bld 132 (*)    BUN 24 (*)    GFR calc non Af Amer 88 (*)    All other components within normal limits  PROTIME-INR - Abnormal; Notable for the following:    Prothrombin Time 30.8 (*)    INR 3.10 (*)    All other components within normal limits  CULTURE, BLOOD (ROUTINE X 2)  CULTURE, BLOOD (ROUTINE X 2)  D-DIMER, QUANTITATIVE   No results found.  Diagnosis: Cellulitis  MDM  Patient presents to the ER for evaluation of pain and swelling of the right lower leg. Patient has pain, swelling and induration of the calf area. Patient reports is to be treated for similar type changes in the right thigh area with clindamycin for cellulitis. This reports that the symptoms are similar today. Workup was unremarkable. He has slightly supratherapeutic INR. D-dimer was negative, no concern for DVT. There is no fluctuance in the area, I do not suspect abscess. Findings are most consistent with cellulitis which will be treated once again with clindamycin. Initiate IV, will prescribe oral course and return to the ER if symptoms worsen.  I personally performed the services described in this documentation, which was scribed in my presence. The recorded information has been reviewed and is accurate.    Gilda Crease, MD 08/07/12 1201  Gilda Crease, MD 08/22/12 [phone]

## 2012-08-08 ENCOUNTER — Ambulatory Visit: Payer: Medicare Other | Admitting: Pharmacist Clinician (PhC)/ Clinical Pharmacy Specialist

## 2012-08-12 LAB — CULTURE, BLOOD (ROUTINE X 2)
Culture: NO GROWTH
Culture: NO GROWTH

## 2012-08-21 ENCOUNTER — Ambulatory Visit (INDEPENDENT_AMBULATORY_CARE_PROVIDER_SITE_OTHER): Payer: Medicare Other | Admitting: Pharmacist Clinician (PhC)/ Clinical Pharmacy Specialist

## 2012-08-21 DIAGNOSIS — I4891 Unspecified atrial fibrillation: Secondary | ICD-10-CM

## 2012-08-21 DIAGNOSIS — Z7901 Long term (current) use of anticoagulants: Secondary | ICD-10-CM

## 2012-08-21 LAB — POCT INR: INR: 4

## 2012-08-21 MED ORDER — WARFARIN SODIUM 5 MG PO TABS: ORAL_TABLET | ORAL | Status: DC

## 2012-08-21 MED ORDER — DILTIAZEM HCL ER COATED BEADS 360 MG PO CP24: 360.0000 mg | ORAL_CAPSULE | Freq: Every day | ORAL | Status: DC

## 2012-08-27 ENCOUNTER — Emergency Department (HOSPITAL_COMMUNITY)
Admission: EM | Admit: 2012-08-27 | Discharge: 2012-08-27 | Disposition: A | Discharge status: Home / Self Care | Payer: Medicare Other | Attending: Emergency Medicine | Admitting: Emergency Medicine

## 2012-08-27 ENCOUNTER — Encounter (HOSPITAL_COMMUNITY): Payer: Self-pay | Admitting: *Deleted

## 2012-08-27 DIAGNOSIS — I10 Essential (primary) hypertension: Secondary | ICD-10-CM | POA: Insufficient documentation

## 2012-08-27 DIAGNOSIS — I82409 Acute embolism and thrombosis of unspecified deep veins of unspecified lower extremity: Secondary | ICD-10-CM | POA: Insufficient documentation

## 2012-08-27 DIAGNOSIS — Y9289 Other specified places as the place of occurrence of the external cause: Secondary | ICD-10-CM | POA: Insufficient documentation

## 2012-08-27 DIAGNOSIS — S80811A Abrasion, right lower leg, initial encounter: Secondary | ICD-10-CM

## 2012-08-27 DIAGNOSIS — E669 Obesity, unspecified: Secondary | ICD-10-CM | POA: Insufficient documentation

## 2012-08-27 DIAGNOSIS — Z87891 Personal history of nicotine dependence: Secondary | ICD-10-CM | POA: Insufficient documentation

## 2012-08-27 DIAGNOSIS — Z862 Personal history of diseases of the blood and blood-forming organs and certain disorders involving the immune mechanism: Secondary | ICD-10-CM | POA: Insufficient documentation

## 2012-08-27 DIAGNOSIS — I4891 Unspecified atrial fibrillation: Secondary | ICD-10-CM | POA: Insufficient documentation

## 2012-08-27 DIAGNOSIS — Z7901 Long term (current) use of anticoagulants: Secondary | ICD-10-CM | POA: Insufficient documentation

## 2012-08-27 DIAGNOSIS — Z8639 Personal history of other endocrine, nutritional and metabolic disease: Secondary | ICD-10-CM | POA: Insufficient documentation

## 2012-08-27 DIAGNOSIS — Z792 Long term (current) use of antibiotics: Secondary | ICD-10-CM | POA: Insufficient documentation

## 2012-08-27 DIAGNOSIS — Z79899 Other long term (current) drug therapy: Secondary | ICD-10-CM | POA: Insufficient documentation

## 2012-08-27 DIAGNOSIS — Y9389 Activity, other specified: Secondary | ICD-10-CM | POA: Insufficient documentation

## 2012-08-27 DIAGNOSIS — IMO0002 Reserved for concepts with insufficient information to code with codable children: Secondary | ICD-10-CM | POA: Insufficient documentation

## 2012-08-27 DIAGNOSIS — L02419 Cutaneous abscess of limb, unspecified: Secondary | ICD-10-CM | POA: Insufficient documentation

## 2012-08-27 DIAGNOSIS — W2209XA Striking against other stationary object, initial encounter: Secondary | ICD-10-CM | POA: Insufficient documentation

## 2012-08-27 MED ORDER — TETANUS-DIPHTH-ACELL PERTUSSIS 5-2.5-18.5 LF-MCG/0.5 IM SUSP
0.5000 mL | Freq: Once | INTRAMUSCULAR | Status: AC
  Administered 2012-08-27: 0.5 mL via INTRAMUSCULAR
  Filled 2012-08-27: qty 0.5

## 2012-08-27 NOTE — ED Provider Notes (Signed)
CSN: [id number]     Arrival date & time 08/27/12  1358 History  This chart was scribed for Charles B. Bernette Mayers, MD,  by Ashley Jacobs, ED Scribe. The patient was seen in room APA11/APA11 and the patient's care was started at 2:52 PM   First MD Initiated Contact with Patient 08/27/12 1447     Chief Complaint  Patient presents with  . Extremity Laceration   (Consider location/radiation/quality/duration/timing/severity/associated sxs/prior Treatment) Patient is a 62 y.o. male presenting with skin laceration. The history is provided by the patient and medical records. No language interpreter was used.  Laceration  HPI Comments: [name] is a 62 y.o. male who presents to the Emergency Department complaining of extremity laceration after hitting his lower right leg against the edge of a car door this afternoon. Afterwards pt had trouble controlling the area of bleeding and states placing a sock around the wound with no improvement. Pt mentions that he is currently on Coumadin and is schedule to have his blood count checked next week . He states has been taking antibiotics for cellulitis in that area, finished course today.   Past Medical History  Diagnosis Date  . Obesity   . Hypertension   . Atrial fibrillation   . Cellulitis   . Hyperlipidemia   . Deep vein thrombosis     hx of   Past Surgical History  Procedure Laterality Date  . Abdominal surgery     Family History  Problem Relation Age of Onset  . Cancer Mother    History  Substance Use Topics  . Smoking status: Former Games developer  . Smokeless tobacco: Not on file     Comment: quit 2012  . Alcohol Use: Yes     Comment: occasional    Review of Systems  Skin: Positive for wound (right lower leg).  Hematological: Bruises/bleeds easily.  All other systems reviewed and are negative.    Allergies  Strawberry; Tetracyclines & related; and Vancomycin  Home Medications   Current Outpatient Rx  Name  Route  Sig   Dispense  Refill  . clindamycin (CLEOCIN) 150 MG capsule   Oral   Take 2 capsules (300 mg total) by mouth 4 (four) times daily.   80 capsule   0   . diltiazem (CARDIZEM CD) 360 MG 24 hr capsule   Oral   Take 1 capsule (360 mg total) by mouth daily.   90 capsule   0   . ibuprofen (ADVIL,MOTRIN) 200 MG tablet   Oral   Take 600 mg by mouth every 6 (six) hours as needed for pain.         Marland Kitchen oxyCODONE-acetaminophen (PERCOCET/ROXICET) 5-325 MG per tablet   Oral   Take 1 tablet by mouth daily.         . Tamsulosin HCl (FLOMAX) 0.4 MG CAPS   Oral   Take 0.4 mg by mouth daily after supper.           . warfarin (COUMADIN) 5 MG tablet      Take 1 &1/2 -2 tablets by mouth daily as directed   150 tablet   1    BP 153/61  Pulse 60  Temp(Src) 97.4 F (36.3 C) (Oral)  Resp 20  SpO2 93% Physical Exam  Nursing note and vitals reviewed. Constitutional: He is oriented to person, place, and time. He appears well-developed and well-nourished.  HENT:  Head: Normocephalic and atraumatic.  Neck: Neck supple.  Pulmonary/Chest: Effort normal.  Musculoskeletal: He exhibits  edema (RLE chronic).  Neurological: He is alert and oriented to person, place, and time. No cranial nerve deficit.  Skin:  Superficial abrasion x 2 on R lateral lower leg without active bleeding, pressure dressing applied  Psychiatric: He has a normal mood and affect. His behavior is normal.    ED Course  Procedures (including critical care time)\ DIAGNOSTIC STUDIES: Oxygen Saturation is 93% on room air, normal by my interpretation.    COORDINATION OF CARE: 2:55 Discussed course of care with pt . Pt understands and agrees.  Labs Review Labs Reviewed - No data to display Imaging Review No results found.  MDM   1. Abrasion of right lower leg, initial encounter    Wound care instructions, dressing change later this evening and close PCP followup for wound check. Tdap updated.   I personally performed  the services described in this documentation, which was scribed in my presence. The recorded information has been reviewed and is accurate.          Charles B. Bernette Mayers, MD 08/27/12 [phone]

## 2012-08-27 NOTE — ED Notes (Signed)
Pt reports was getting out of the car and hit r lower leg on car door.  Pt has been treated for cellulitis is same extremity.  Leg red and swollen but has improved.  Bleeding controlled.  Applied new dressing.

## 2012-08-27 NOTE — ED Notes (Signed)
Pt states that he hit his right lower leg against the edge of the car door this afternoon, has had problems with getting the area to stop bleeding, pt has compression stocking in place to right lower leg, laceration continues to bleed. Right lower leg red, pt states that he took last antibiotic for cellulitis this am, currently takes coumadin

## 2012-09-16 ENCOUNTER — Telehealth: Payer: Self-pay | Admitting: Pharmacist Clinician (PhC)/ Clinical Pharmacy Specialist

## 2012-09-16 NOTE — Telephone Encounter (Signed)
Overdue INR letter sent 

## 2012-09-30 ENCOUNTER — Other Ambulatory Visit: Payer: Self-pay | Admitting: Internal Medicine

## 2012-09-30 LAB — PROTIME-INR
INR: 3.29 — ABNORMAL HIGH (ref <1.50)
Prothrombin Time: 31.9 seconds — ABNORMAL HIGH (ref 11.6–15.2)

## 2012-11-19 ENCOUNTER — Telehealth: Payer: Self-pay | Admitting: Internal Medicine

## 2012-11-19 NOTE — Telephone Encounter (Signed)
Alice called back.  Stated pt is having bilateral folliculectomy under general anesthesia.  Pt has not been scheduled yet.  Fax number [phone], Attn: Alice.  Informed MD/nurse will be notified to process request and fax information to her.  Verbalized understanding.  Message forwarded to Dr. Dorene Grebe, RN.  Paper chart# [id number] placed on Dr. Blanchie Dessert cart.

## 2012-11-19 NOTE — Telephone Encounter (Signed)
Returned call and pt verified x 2.  Pt stated he is having surgery on his legs and needs clearance to have it done.  RN asked pt if clearance form was sent to Dr. Rennis Golden and pt unsure.  Pt also unsure of name of surgery.  Stated he has two growths on the inside of his legs; one is the size of a volleyball and the other the size of a grapefruit.  Stated they are going to remove them.  RN asked for number to office so RN can call to get more information.  Pt stated number is [phone] and he spoke w/ Fulton Mole.  Call to [facility] and left voicemail to call the office with more details or fax [phone], Attn: Jenna with clearance form.

## 2012-11-19 NOTE — Telephone Encounter (Signed)
Having surgery on his legs and [name] is asking for cardiac clearance and would like if we can fax something to them.. The fax number is [phone] and put attention Alice .Marland Kitchen Please call if there are any questions .Marland Kitchen   Thanks

## 2012-11-20 ENCOUNTER — Encounter: Payer: Self-pay | Admitting: Internal Medicine

## 2012-11-20 NOTE — Telephone Encounter (Signed)
Faxed to [phone] Attn Alice statement on patient's medical risk for surgery signed by Dr. Rennis Golden.

## 2012-11-21 ENCOUNTER — Ambulatory Visit: Payer: Medicare Other | Admitting: Internal Medicine

## 2012-11-25 ENCOUNTER — Encounter (HOSPITAL_COMMUNITY): Payer: Self-pay | Admitting: Emergency Medicine

## 2012-11-25 ENCOUNTER — Emergency Department (HOSPITAL_COMMUNITY)
Admission: EM | Admit: 2012-11-25 | Discharge: 2012-11-25 | Disposition: A | Discharge status: Home / Self Care | Payer: Medicare Other | Attending: Emergency Medicine | Admitting: Emergency Medicine

## 2012-11-25 DIAGNOSIS — I4891 Unspecified atrial fibrillation: Secondary | ICD-10-CM | POA: Insufficient documentation

## 2012-11-25 DIAGNOSIS — Z87891 Personal history of nicotine dependence: Secondary | ICD-10-CM | POA: Insufficient documentation

## 2012-11-25 DIAGNOSIS — Z79899 Other long term (current) drug therapy: Secondary | ICD-10-CM | POA: Insufficient documentation

## 2012-11-25 DIAGNOSIS — E669 Obesity, unspecified: Secondary | ICD-10-CM | POA: Insufficient documentation

## 2012-11-25 DIAGNOSIS — Z86718 Personal history of other venous thrombosis and embolism: Secondary | ICD-10-CM | POA: Insufficient documentation

## 2012-11-25 DIAGNOSIS — L02419 Cutaneous abscess of limb, unspecified: Secondary | ICD-10-CM | POA: Insufficient documentation

## 2012-11-25 DIAGNOSIS — I10 Essential (primary) hypertension: Secondary | ICD-10-CM | POA: Insufficient documentation

## 2012-11-25 DIAGNOSIS — L0291 Cutaneous abscess, unspecified: Secondary | ICD-10-CM

## 2012-11-25 DIAGNOSIS — Z7901 Long term (current) use of anticoagulants: Secondary | ICD-10-CM | POA: Insufficient documentation

## 2012-11-25 MED ORDER — CEFTRIAXONE SODIUM 1 G IJ SOLR
1.0000 g | Freq: Once | INTRAMUSCULAR | Status: AC
  Administered 2012-11-25: 1 g via INTRAMUSCULAR
  Filled 2012-11-25: qty 10

## 2012-11-25 MED ORDER — LIDOCAINE HCL (PF) 2 % IJ SOLN
INTRAMUSCULAR | Status: AC
  Filled 2012-11-25: qty 10

## 2012-11-25 MED ORDER — LIDOCAINE HCL (PF) 1 % IJ SOLN
INTRAMUSCULAR | Status: AC
  Filled 2012-11-25: qty 5

## 2012-11-25 MED ORDER — OXYCODONE-ACETAMINOPHEN 5-325 MG PO TABS
1.0000 | ORAL_TABLET | Freq: Once | ORAL | Status: AC
  Administered 2012-11-25: 1 via ORAL
  Filled 2012-11-25: qty 1

## 2012-11-25 MED ORDER — SULFAMETHOXAZOLE-TRIMETHOPRIM 800-160 MG PO TABS: 1.0000 | ORAL_TABLET | Freq: Two times a day (BID) | ORAL | Status: DC

## 2012-11-25 NOTE — ED Provider Notes (Signed)
CSN: [id number]     Arrival date & time 11/25/12  1239 History   First MD Initiated Contact with Patient 11/25/12 1354     No chief complaint on file.  (Consider location/radiation/quality/duration/timing/severity/associated sxs/prior Treatment) Patient is a 62 y.o. male presenting with abscess. The history is provided by the patient (pt complains of abscess to right thigh).  Abscess Abscess location: right thigh. Abscess quality: painful   Red streaking: no   Progression:  Worsening Pain details:    Quality:  Aching   Severity:  Moderate Associated symptoms: no fatigue and no headaches     Past Medical History  Diagnosis Date  . Obesity   . Hypertension   . Atrial fibrillation   . Cellulitis   . Hyperlipidemia   . Deep vein thrombosis     hx of   Past Surgical History  Procedure Laterality Date  . Abdominal surgery     Family History  Problem Relation Age of Onset  . Cancer Mother    History  Substance Use Topics  . Smoking status: Former Games developer  . Smokeless tobacco: Not on file     Comment: quit 2012  . Alcohol Use: Yes     Comment: occasional    Review of Systems  Constitutional: Negative for appetite change and fatigue.  HENT: Negative for congestion, ear discharge and sinus pressure.   Eyes: Negative for discharge.  Respiratory: Negative for cough.   Cardiovascular: Negative for chest pain.  Gastrointestinal: Negative for abdominal pain and diarrhea.  Genitourinary: Negative for frequency and hematuria.  Musculoskeletal: Negative for back pain.  Skin: Positive for rash.  Neurological: Negative for seizures and headaches.  Psychiatric/Behavioral: Negative for hallucinations.    Allergies  Strawberry; Tetracyclines & related; and Vancomycin  Home Medications   Current Outpatient Rx  Name  Route  Sig  Dispense  Refill  . diltiazem (CARDIZEM CD) 360 MG 24 hr capsule   Oral   Take 1 capsule (360 mg total) by mouth daily.   90 capsule   0   .  ibuprofen (ADVIL,MOTRIN) 200 MG tablet   Oral   Take 600 mg by mouth every 6 (six) hours as needed for pain.         Marland Kitchen OVER THE COUNTER MEDICATION   Oral   Take 1 tablet by mouth 2 (two) times daily. OTC "better prostrate" supplement         . oxyCODONE-acetaminophen (PERCOCET/ROXICET) 5-325 MG per tablet   Oral   Take 1 tablet by mouth daily.         . Tamsulosin HCl (FLOMAX) 0.4 MG CAPS   Oral   Take 0.4 mg by mouth daily after supper.           . warfarin (COUMADIN) 5 MG tablet   Oral   Take 5 mg by mouth daily. Monday-Wednesday-Friday=7.5 mg (1.5 tablets) all other days=10 mg (2 tablets)         . sulfamethoxazole-trimethoprim (SEPTRA DS) 800-160 MG per tablet   Oral   Take 1 tablet by mouth 2 (two) times daily.   20 tablet   0    BP 136/47  Pulse 75  Temp(Src) 98.1 F (36.7 C) (Oral)  Resp 24  Ht 5\' 11"  (1.803 m)  Wt 482 lb (218.634 kg)  BMI 67.26 kg/m2  SpO2 95% Physical Exam  Constitutional: He is oriented to person, place, and time. He appears well-developed.  HENT:  Head: Normocephalic.  Eyes: Conjunctivae are normal.  Neck:  No tracheal deviation present.  Cardiovascular:  No murmur heard. Musculoskeletal: Normal range of motion.  Large abscess right thigh  Neurological: He is oriented to person, place, and time.  Skin: Skin is warm.  Psychiatric: He has a normal mood and affect.    ED Course  INCISION AND DRAINAGE Date/Time: 11/25/2012 2:49 PM Performed by: Pearly Bartosik L Authorized by: Bethann Berkshire L Comments: Abscess right thigh.  Lido noepi used to numb.  #11 blade used to make a 2 cm incission.  Puss removed without problems ..packing placed   (including critical care time) Labs Review Labs Reviewed - No data to display Imaging Review No results found.  EKG Interpretation   None       MDM   1. Abscess        Benny Lennert, MD 11/25/12 1450

## 2012-11-25 NOTE — ED Notes (Signed)
Red "knot" to rt thigh

## 2012-11-25 NOTE — ED Notes (Signed)
Assisted Dr.Zammit with an I&D tray. Pt tolerated the procedure well. Wound was covered using telfa and sterile gauze.

## 2012-12-18 ENCOUNTER — Telehealth: Payer: Self-pay | Admitting: Internal Medicine

## 2012-12-18 NOTE — Telephone Encounter (Signed)
Dr Fayne Mediate changed their mind about leaving a message at this time.

## 2013-01-23 ENCOUNTER — Telehealth: Payer: Self-pay | Admitting: Pharmacist Clinician (PhC)/ Clinical Pharmacy Specialist

## 2013-01-23 NOTE — Telephone Encounter (Signed)
Called pt to inquire as to who monitors INR for him.  Pt stated has been in/out of hospital recently, is currently inpatient.  Will call after discharge to review INR monitoring.

## 2013-02-24 ENCOUNTER — Telehealth: Payer: Self-pay | Admitting: Internal Medicine

## 2013-02-24 MED ORDER — DILTIAZEM HCL ER COATED BEADS 360 MG PO CP24: 360.0000 mg | ORAL_CAPSULE | Freq: Every day | ORAL | Status: DC

## 2013-02-24 NOTE — Telephone Encounter (Signed)
Need a refill on Diltiazem 360 mg #90

## 2013-02-24 NOTE — Telephone Encounter (Signed)
Refill(s) sent to pharmacy.  Directions included no refills without being seen.  Pt cancelled last appt in Nov. 2014.  Reviewed OVs and unable to find last OV in [facility] or in Epic.  Call to pharmacy and spoke w/ Lamar LaundrySonya to cancel Rx.  Advised pt call the office for an appointment.  Agreed to cancel Rx once received.

## 2013-02-25 ENCOUNTER — Telehealth: Payer: Self-pay | Admitting: *Deleted

## 2013-02-25 MED ORDER — DILTIAZEM HCL ER COATED BEADS 360 MG PO CP24: 360.0000 mg | ORAL_CAPSULE | Freq: Every day | ORAL | Status: DC

## 2013-02-25 NOTE — Telephone Encounter (Signed)
Phoned in #30 with 0 refills. Patient will be seen 03/04/13  Attempted to call patient to notify of this refill, no answer on home or cell phone.

## 2013-02-25 NOTE — Telephone Encounter (Signed)
Pt called the pharmacy because he needed a refill on his Diltiazem 360 mg. He was told since he has not been here in a while he needed to have an appointment. He made an appointment for Tuesday 3/3 with Dr. Rennis GoldenHilty and he will run out of his medication on Thursday. He needed to know if we can fill it for him.  CH

## 2013-02-28 ENCOUNTER — Telehealth: Payer: Self-pay | Admitting: Internal Medicine

## 2013-02-28 NOTE — Telephone Encounter (Signed)
Provided patient with information regarding location of wheelchairs in our building - glass enclosure of P[address]

## 2013-02-28 NOTE — Telephone Encounter (Signed)
Per answering service-He wants to know if we have wheelchairs? He will need one for his appt on 03-04-13.Please call him and let him know

## 2013-03-04 ENCOUNTER — Ambulatory Visit: Payer: Medicare Other | Admitting: Internal Medicine

## 2013-03-13 ENCOUNTER — Telehealth: Payer: Self-pay | Admitting: Internal Medicine

## 2013-03-13 NOTE — Telephone Encounter (Signed)
Revonda StandardAllison, therapist w/ [facility], called.  Concerned pt's HR ranging from 50-88 at rest.  Stated pt denied symptoms, but did c/o having some tightness in his chest.  Stated she was told she soonest pt could be seen was on Tuesday.  Revonda Standardllison informed pt is on a HR control med and it is not abnormal to have a HR in the 50s b/c of that, if pt is asymptomatic.  Informed RN will call pt to check on status of chest tightness as there are no available appts sooner than Tuesday.  Verbalized understanding.  Call to pt and verified x 2.  Pt informed RN spoke w/ therapist and asked if he is having CP/tightness now.  Pt stated it's hard to describe b/c he's never had problems w/ it before.  Stated he thinks it feels more like a cold.  C/o having dizziness last night r/t an inner ear issue that he is taking medicine for.  Pt denied having NTG.  Pt informed it is not abnormal to have HR in 50 while on HR control med.  Advised he increase water intake and continue to monitor.  Advised ER for CP/pressure/tightness, SOB or radiating pains and keep appt on Tuesday as scheduled w/ PA-C.  Pt verbalized understanding and agreed w/ plan.  Stated he doesn't think he needs to be seen before Tuesday.

## 2013-03-18 ENCOUNTER — Ambulatory Visit (INDEPENDENT_AMBULATORY_CARE_PROVIDER_SITE_OTHER): Payer: Medicare Other | Admitting: Cardiology

## 2013-03-18 ENCOUNTER — Encounter: Payer: Self-pay | Admitting: Cardiology

## 2013-03-18 VITALS — BP 144/80 | HR 110 | Ht 71.0 in | Wt >= 6400 oz

## 2013-03-18 DIAGNOSIS — E669 Obesity, unspecified: Secondary | ICD-10-CM | POA: Insufficient documentation

## 2013-03-18 DIAGNOSIS — I82409 Acute embolism and thrombosis of unspecified deep veins of unspecified lower extremity: Secondary | ICD-10-CM | POA: Insufficient documentation

## 2013-03-18 DIAGNOSIS — I482 Chronic atrial fibrillation, unspecified: Secondary | ICD-10-CM

## 2013-03-18 DIAGNOSIS — I4891 Unspecified atrial fibrillation: Secondary | ICD-10-CM

## 2013-03-18 DIAGNOSIS — Z7901 Long term (current) use of anticoagulants: Secondary | ICD-10-CM

## 2013-03-18 NOTE — Assessment & Plan Note (Signed)
Super morbid obesity

## 2013-03-18 NOTE — Assessment & Plan Note (Signed)
Reportedly has slow rates this past weekend- "[facility]

## 2013-03-18 NOTE — Assessment & Plan Note (Signed)
He has not been compliant with INR checks secondary to mobility issues

## 2013-03-18 NOTE — Assessment & Plan Note (Signed)
INR goal was increased to 2.5-3.0

## 2013-03-18 NOTE — Patient Instructions (Addendum)
[place] [phone] for follow up. Decrease Coumadin to 10 mg MWF and 7.5 mg T, Th, Sat, Sun Your physician recommends that you schedule a follow-up appointment in: 6 months with Dr Rennis GoldenHilty

## 2013-03-18 NOTE — Progress Notes (Signed)
03/18/2013 [name]   Apr 09, 1950  [id number]  Primary Physicia Colette RibasGOLDING, Randall CABOT, MD Primary Cardiologist: Dr Rennis GoldenHilty (last seen 2012)  HPI: The pt is a [age] year old gentleman who we saw in June 2012 for a right lower extremity DVT. This was unusual, as he was therapeutic on Coumadin for atrial fibrillation. It may be related to his supermorbid obesity and body weight in excess of 500 pounds. Either way, he underwent successful IVC filter placement and his Coumadin targets have been increased to INRs between 2.5 and 3.0.           We had not seen in follow up since July 2012. He tells me he had [facility]. He then spent 7 weeks in rehab. He comes to the clinic today after a home health nurse reported a HR of 50 this past weekend. He denies syncope or near syncope. He has not been getting hjs INR checked and today it is 3.8.     Current Outpatient Prescriptions  Medication Sig Dispense Refill  . diltiazem (CARDIZEM CD) 360 MG 24 hr capsule Take 1 capsule (360 mg total) by mouth daily.  30 capsule  0  . ibuprofen (ADVIL,MOTRIN) 200 MG tablet Take 600 mg by mouth every 6 (six) hours as needed for pain.      . meclizine (ANTIVERT) 25 MG tablet Take 25 mg by mouth daily.      Marland Kitchen. OVER THE COUNTER MEDICATION Take 1 tablet by mouth 2 (two) times daily. OTC "better prostrate" supplement      . oxyCODONE-acetaminophen (PERCOCET/ROXICET) 5-325 MG per tablet Take 1 tablet by mouth daily.      Marland Kitchen. warfarin (COUMADIN) 5 MG tablet Take 5 mg by mouth daily. Monday-Wednesday-Friday=7.5 mg (1.5 tablets) all other days=10 mg (2 tablets)       No current facility-administered medications for this visit.    Allergies  Allergen Reactions  . Strawberry Hives  . Tetracyclines & Related Rash  . Vancomycin Rash    History   Social History  . Marital Status: Divorced    Spouse Name: N/A    Number of Children: 2  . Years of Education: N/A   Occupational  History  . Not on file.   Social History Main Topics  . Smoking status: Former Smoker    Quit date: 01/02/1993  . Smokeless tobacco: Not on file     Comment: quit 2012  . Alcohol Use: Yes     Comment: occasional  . Drug Use: No  . Sexual Activity: Not on file   Other Topics Concern  . Not on file   Social History Narrative  . No narrative on file     Review of Systems: General: negative for chills, fever, night sweats or weight changes.  Cardiovascular: negative for chest pain, dyspnea on exertion, edema, orthopnea, palpitations, paroxysmal nocturnal dyspnea or shortness of breath Dermatological: negative for rash Respiratory: negative for cough or wheezing Urologic: negative for hematuria Abdominal: negative for nausea, vomiting, diarrhea, bright red blood per rectum, melena, or hematemesis Neurologic: negative for visual changes, syncope, or dizziness All other systems reviewed and are otherwise negative except as noted above.    Blood pressure 144/80, pulse 110, height 5\' 11"  (1.803 m), weight 461 lb 1.6 oz (209.154 kg).  General appearance: alert, cooperative, no distress and morbidly obese Lungs: clear to auscultation bilaterally Heart: irregularly irregular rhythm  EKG AF 110  ASSESSMENT AND PLAN:  Chronic atrial fibrillation Reportedly has slow rates this past weekend- "50's" per [facility]  Obesity- BMI 64 Super morbid obesity  Long term (current) use of anticoagulants He has not been compliant with INR checks secondary to mobility issues  DVT Rt leg 2012 despite theraputic Coumadin- IVC placed INR goal was increased to 2.5-3.0   PLAN  I did not change [name]'s Diltiazem. He will call if low HR becomes more of a problem. He wants to be followed in [place] and we will arrange follow up there with the Coumadin clinic and with a provider. His INR goal after his DVT was 2.5-3.0. His INR today is 3.8. I decreased his Coumadin to 7.5 mg daily with 10 mg MWF.     Revis Whalin KPA-C 03/18/2013 9:25 AM

## 2013-03-25 ENCOUNTER — Ambulatory Visit: Payer: Medicare Other | Admitting: Internal Medicine

## 2013-03-28 ENCOUNTER — Other Ambulatory Visit: Payer: Self-pay | Admitting: *Deleted

## 2013-03-28 MED ORDER — DILTIAZEM HCL ER COATED BEADS 360 MG PO CP24: 360.0000 mg | ORAL_CAPSULE | Freq: Every day | ORAL | Status: DC

## 2013-04-23 ENCOUNTER — Ambulatory Visit (INDEPENDENT_AMBULATORY_CARE_PROVIDER_SITE_OTHER): Payer: Medicare Other | Admitting: *Deleted

## 2013-04-23 DIAGNOSIS — I482 Chronic atrial fibrillation, unspecified: Secondary | ICD-10-CM

## 2013-04-23 DIAGNOSIS — Z7901 Long term (current) use of anticoagulants: Secondary | ICD-10-CM

## 2013-04-23 DIAGNOSIS — Z5181 Encounter for therapeutic drug level monitoring: Secondary | ICD-10-CM | POA: Insufficient documentation

## 2013-04-23 DIAGNOSIS — I4891 Unspecified atrial fibrillation: Secondary | ICD-10-CM

## 2013-04-23 LAB — POCT INR: INR: 2.4

## 2013-05-07 ENCOUNTER — Telehealth: Payer: Self-pay | Admitting: Internal Medicine

## 2013-05-07 MED ORDER — WARFARIN SODIUM 5 MG PO TABS: ORAL_TABLET | ORAL | Status: DC

## 2013-05-07 NOTE — Telephone Encounter (Signed)
Message forwarded to Kristin Alvstad, PharmD.  

## 2013-05-07 NOTE — Telephone Encounter (Signed)
Need refill on Coumadin 5mg  #150. Would like to also clarify directions please.

## 2013-05-08 ENCOUNTER — Telehealth: Payer: Self-pay | Admitting: Internal Medicine

## 2013-05-08 NOTE — Telephone Encounter (Signed)
Called pharmacy to verify refill had been sent in/recieved. They got the refill.

## 2013-05-08 NOTE — Telephone Encounter (Signed)
Patient notified

## 2013-05-08 NOTE — Telephone Encounter (Signed)
Pt said he called his pharmacy and his Coumadin is still not there. Pt states he needs his Coumadin.

## 2013-05-21 ENCOUNTER — Ambulatory Visit (INDEPENDENT_AMBULATORY_CARE_PROVIDER_SITE_OTHER): Payer: Medicare Other | Admitting: *Deleted

## 2013-05-21 DIAGNOSIS — I482 Chronic atrial fibrillation, unspecified: Secondary | ICD-10-CM

## 2013-05-21 DIAGNOSIS — Z5181 Encounter for therapeutic drug level monitoring: Secondary | ICD-10-CM

## 2013-05-21 DIAGNOSIS — I4891 Unspecified atrial fibrillation: Secondary | ICD-10-CM

## 2013-05-21 DIAGNOSIS — Z7901 Long term (current) use of anticoagulants: Secondary | ICD-10-CM

## 2013-05-21 LAB — POCT INR: INR: 2.6

## 2013-07-04 ENCOUNTER — Emergency Department (HOSPITAL_COMMUNITY)
Admission: EM | Admit: 2013-07-04 | Discharge: 2013-07-04 | Disposition: A | Discharge status: Home / Self Care | Payer: Medicare Other | Attending: Emergency Medicine | Admitting: Emergency Medicine

## 2013-07-04 ENCOUNTER — Encounter (HOSPITAL_COMMUNITY): Payer: Self-pay | Admitting: Emergency Medicine

## 2013-07-04 DIAGNOSIS — Z87891 Personal history of nicotine dependence: Secondary | ICD-10-CM | POA: Insufficient documentation

## 2013-07-04 DIAGNOSIS — Z872 Personal history of diseases of the skin and subcutaneous tissue: Secondary | ICD-10-CM | POA: Insufficient documentation

## 2013-07-04 DIAGNOSIS — Z7901 Long term (current) use of anticoagulants: Secondary | ICD-10-CM | POA: Insufficient documentation

## 2013-07-04 DIAGNOSIS — Y838 Other surgical procedures as the cause of abnormal reaction of the patient, or of later complication, without mention of misadventure at the time of the procedure: Secondary | ICD-10-CM | POA: Insufficient documentation

## 2013-07-04 DIAGNOSIS — E669 Obesity, unspecified: Secondary | ICD-10-CM | POA: Insufficient documentation

## 2013-07-04 DIAGNOSIS — Z79899 Other long term (current) drug therapy: Secondary | ICD-10-CM | POA: Insufficient documentation

## 2013-07-04 DIAGNOSIS — Z792 Long term (current) use of antibiotics: Secondary | ICD-10-CM | POA: Insufficient documentation

## 2013-07-04 DIAGNOSIS — I10 Essential (primary) hypertension: Secondary | ICD-10-CM | POA: Insufficient documentation

## 2013-07-04 DIAGNOSIS — T85698A Other mechanical complication of other specified internal prosthetic devices, implants and grafts, initial encounter: Secondary | ICD-10-CM | POA: Insufficient documentation

## 2013-07-04 DIAGNOSIS — Z86718 Personal history of other venous thrombosis and embolism: Secondary | ICD-10-CM | POA: Insufficient documentation

## 2013-07-04 DIAGNOSIS — I4891 Unspecified atrial fibrillation: Secondary | ICD-10-CM | POA: Insufficient documentation

## 2013-07-04 NOTE — Discharge Instructions (Signed)
Please don't use scissors during drainage change to avoid cutting the JP tube.

## 2013-07-04 NOTE — ED Provider Notes (Signed)
I saw and evaluated the patient, reviewed the resident's note and I agree with the findings and plan.   EKG Interpretation None      62y[facility] drain R groin cut with bandage change today. Tried taping over area, but not holding suction.   Tubing was cut square just above area that was damaged and bulb reconnected. Tubing now much shorter but should function fine. Follow back up with surgery as previously recommended.   Raeford RazorStephen Joriel Streety, MD 07/04/13 1030

## 2013-07-04 NOTE — ED Notes (Signed)
Dr. Juleen Chinakohut at bedside- cut cord and re-attached JP drain. Drain holding suction.

## 2013-07-04 NOTE — ED Provider Notes (Signed)
CSN: [id number]     Arrival date & time 07/04/13  [id number] History   First MD Initiated Contact with Patient 07/04/13 [phone]     No chief complaint on file.    (Consider location/radiation/quality/duration/timing/severity/associated sxs/prior Treatment) HPI  63 yo male with hx of obesity and panniculutis, s/p bilateral [facility] from [facility] after his right thigh JP was cut accidentally during dressing change. Other than this he has been doing well. Denies any pain on the surgical site, denies fever, sob, cough, chills, chest pain. Has foley to keep surgical area dry and intact. Has appointment to f/up with Dr. Kathie Dikeefranzo next week when they will decide to take out folley and JP drain.      Past Medical History  Diagnosis Date  . Obesity   . Hypertension   . Atrial fibrillation   . Cellulitis   . Hyperlipidemia   . Deep vein thrombosis     hx of   Past Surgical History  Procedure Laterality Date  . Abdominal surgery    . Panniculectomy     Family History  Problem Relation Age of Onset  . Cancer Mother    History  Substance Use Topics  . Smoking status: Former Smoker    Quit date: 01/02/1993  . Smokeless tobacco: Not on file     Comment: quit 2012  . Alcohol Use: Yes     Comment: occasional    Review of Systems  Constitutional: Negative for fever, chills and fatigue.  HENT: Negative for ear pain and rhinorrhea.   Eyes: Negative for pain.  Respiratory: Negative for cough, chest tightness, shortness of breath and wheezing.   Cardiovascular: Negative for chest pain and palpitations.  Gastrointestinal: Negative for nausea, vomiting, abdominal pain, diarrhea, blood in stool and rectal pain.  Musculoskeletal: Negative for arthralgias and neck pain.  Neurological: Negative for dizziness, seizures, syncope and headaches.      Allergies  Strawberry; Adhesive ; Clindamycin; Tetracyclines & related; and Vancomycin  Home Medications    Prior to Admission medications   Medication Sig Start Date End Date Taking? Authorizing Provider  ciprofloxacin (CIPRO) 500 MG tablet Take 500 mg by mouth 2 (two) times daily. 07/03/13 07/16/13 Yes Historical Provider, MD  diltiazem (CARDIZEM LA) 360 MG 24 hr tablet Take 360 mg by mouth daily.   Yes Historical Provider, MD  ibuprofen (ADVIL,MOTRIN) 200 MG tablet Take 600 mg by mouth every 6 (six) hours as needed for pain.   Yes Historical Provider, MD  magnesium oxide (MAG-OX) 400 MG tablet Take 400 mg by mouth daily.   Yes Historical Provider, MD  meclizine (ANTIVERT) 25 MG tablet Take 25 mg by mouth daily.   Yes Historical Provider, MD  oxyCODONE-acetaminophen (PERCOCET/ROXICET) 5-325 MG per tablet Take 1 tablet by mouth every 6 (six) hours as needed for moderate pain.    Yes Historical Provider, MD  pseudoephedrine-guaifenesin (MUCINEX D) 60-600 MG per tablet Take 1 tablet by mouth every 12 (twelve) hours.   Yes Historical Provider, MD  senna-docusate (SENOKOT-S) 8.6-50 MG per tablet Take 1 tablet by mouth 2 (two) times daily.   Yes Historical Provider, MD  tamsulosin (FLOMAX) 0.4 MG CAPS capsule Take 0.4 mg by mouth daily.   Yes Historical Provider, MD  warfarin (COUMADIN) 10 MG tablet Take 10 mg by mouth See admin instructions. Take 10 mg every Sun, Tues, Thur, Sat   Yes Historical Provider, MD  warfarin (COUMADIN) 7.5 MG tablet Take 7.5 mg by mouth every  Monday, Wednesday, and Friday.   Yes Historical Provider, MD   BP 140/80  Pulse 66  Temp(Src) 97.3 F (36.3 C) (Oral)  Resp 20  Ht 5\' 11"  (1.803 m)  Wt 462 lb (209.562 kg)  BMI 64.46 kg/m2  SpO2 95% Physical Exam  Constitutional: He is oriented to person, place, and time.  HENT:  Head: Normocephalic and atraumatic.  Eyes: Pupils are equal, round, and reactive to light.  Neck: Normal range of motion. Neck supple.  Cardiovascular: Normal rate.  Exam reveals no gallop and no friction rub.   No murmur heard. Pulmonary/Chest: No  respiratory distress. He has no wheezes. He has no rales. He exhibits no tenderness.  Abdominal: Bowel sounds are normal. He exhibits no mass. There is no rebound and no guarding.  Obese. Dressing over panniculectomy site bilateral thighs. Right panniculectomy site tender to palpation. Left panniculectomy site is nontender.   Genitourinary: Penis normal.  Has foley cath.  Musculoskeletal: Normal range of motion.  Neurological: He is alert and oriented to person, place, and time.  Skin: Skin is warm.  Psychiatric: He has a normal mood and affect. His behavior is normal.    ED Course  Procedures (including critical care time) Labs Review Labs Reviewed - No data to display  Imaging Review No results found.   EKG Interpretation None      MDM   Final diagnoses:  None   Right JP drain was cut accidentally during dressing changes. Nurse in ED attempted to tape the tube back together but it was not holding suction. Tubing was then cut just above the damaged area and was reconnected to the bulb with good suction. Tubing is now much shorter than original but is working well. Has appointment to f/up with surgeon Dr. Kathie Dikeefranzo next week. Follow their recommendations.    Hyacinth Meekerasrif Rody Keadle, MD 07/04/13 1040

## 2013-07-04 NOTE — ED Notes (Signed)
Per [facility] pt from [facility], pt had panniculectomy with jpeg drain to right groin placed on 06/27/13. Staff at facility was cleaning dressing and accidentally cut drain. Pt in NAD

## 2013-07-04 NOTE — ED Notes (Signed)
Rn attempted to restore suction to right JP drain with tape- unable to restore suction.   Resident at bedside

## 2013-07-06 NOTE — ED Provider Notes (Signed)
I saw and evaluated the patient, reviewed the resident's note and I agree with the findings and plan.   EKG Interpretation None      See other note.   Raeford RazorStephen Capricia Serda, MD 07/06/13 1102

## 2013-07-09 ENCOUNTER — Emergency Department (HOSPITAL_COMMUNITY)
Admission: EM | Admit: 2013-07-09 | Discharge: 2013-07-09 | Disposition: A | Discharge status: Home / Self Care | Payer: Medicare Other | Attending: Emergency Medicine | Admitting: Emergency Medicine

## 2013-07-09 ENCOUNTER — Encounter (HOSPITAL_COMMUNITY): Payer: Self-pay | Admitting: Emergency Medicine

## 2013-07-09 DIAGNOSIS — Z862 Personal history of diseases of the blood and blood-forming organs and certain disorders involving the immune mechanism: Secondary | ICD-10-CM | POA: Insufficient documentation

## 2013-07-09 DIAGNOSIS — Z9889 Other specified postprocedural states: Secondary | ICD-10-CM

## 2013-07-09 DIAGNOSIS — Z791 Long term (current) use of non-steroidal anti-inflammatories (NSAID): Secondary | ICD-10-CM | POA: Insufficient documentation

## 2013-07-09 DIAGNOSIS — Z79899 Other long term (current) drug therapy: Secondary | ICD-10-CM | POA: Insufficient documentation

## 2013-07-09 DIAGNOSIS — Z87891 Personal history of nicotine dependence: Secondary | ICD-10-CM | POA: Insufficient documentation

## 2013-07-09 DIAGNOSIS — I10 Essential (primary) hypertension: Secondary | ICD-10-CM | POA: Insufficient documentation

## 2013-07-09 DIAGNOSIS — Z872 Personal history of diseases of the skin and subcutaneous tissue: Secondary | ICD-10-CM | POA: Insufficient documentation

## 2013-07-09 DIAGNOSIS — Z86718 Personal history of other venous thrombosis and embolism: Secondary | ICD-10-CM | POA: Insufficient documentation

## 2013-07-09 DIAGNOSIS — Z8639 Personal history of other endocrine, nutritional and metabolic disease: Secondary | ICD-10-CM | POA: Insufficient documentation

## 2013-07-09 DIAGNOSIS — Z4801 Encounter for change or removal of surgical wound dressing: Secondary | ICD-10-CM | POA: Insufficient documentation

## 2013-07-09 DIAGNOSIS — Z7902 Long term (current) use of antithrombotics/antiplatelets: Secondary | ICD-10-CM | POA: Insufficient documentation

## 2013-07-09 DIAGNOSIS — I4891 Unspecified atrial fibrillation: Secondary | ICD-10-CM | POA: Insufficient documentation

## 2013-07-09 DIAGNOSIS — Z7901 Long term (current) use of anticoagulants: Secondary | ICD-10-CM | POA: Insufficient documentation

## 2013-07-09 DIAGNOSIS — Z792 Long term (current) use of antibiotics: Secondary | ICD-10-CM | POA: Insufficient documentation

## 2013-07-09 NOTE — Discharge Instructions (Signed)
-   call surgeon in the morning  - keep incision clean and dry  -frequent dressing changes for drainage

## 2013-07-09 NOTE — ED Provider Notes (Signed)
CSN: [id number]     Arrival date & time 07/09/13  1951 History   First MD Initiated Contact with Patient 07/09/13 2002     Chief Complaint  Patient presents with  . Drainage from Incision     (Consider location/radiation/quality/duration/timing/severity/associated sxs/prior Treatment) HPI 63 year old male with obesity hypertension and A. fib that presents with a postop problem. Patient states that during physical therapy a drain from his recent surgery was pulled. Patient had bilateral excision of excess skin on both legs. Patient has an incision with drain. The left drain with was accidentally pulled. There is a small open area with drainage with a drain was otherwise the incision has minimal erythema and otherwise no bleeding signs of infection. Patient states that his surgeon is Dr. Wynetta Finesefranso at [facility]. The patient denies any pain, fevers nausea vomiting chest pain or shortness of breath. Patient states the drain was draining approximately a third of the bulb daily. Patient states that the drain was to be pulled in one week.  Past Medical History  Diagnosis Date  . Obesity   . Hypertension   . Atrial fibrillation   . Cellulitis   . Hyperlipidemia   . Deep vein thrombosis     hx of   Past Surgical History  Procedure Laterality Date  . Abdominal surgery    . Panniculectomy     Family History  Problem Relation Age of Onset  . Cancer Mother    History  Substance Use Topics  . Smoking status: Former Smoker    Quit date: 01/02/1993  . Smokeless tobacco: Not on file     Comment: quit 2012  . Alcohol Use: Yes     Comment: occasional    Review of Systems  Constitutional: Negative for activity change.  HENT: Negative for congestion.   Eyes: Negative for visual disturbance.  Respiratory: Negative for cough and shortness of breath.   Cardiovascular: Negative for chest pain and leg swelling.  Gastrointestinal: Negative for abdominal pain and blood in stool.   Genitourinary: Negative for dysuria and hematuria.  Musculoskeletal: Negative for back pain.  Skin: Negative for color change.  Neurological: Negative for syncope and headaches.  Psychiatric/Behavioral: Negative for agitation.      Allergies  Strawberry; Adhesive ; Clindamycin; Tetracyclines & related; and Vancomycin  Home Medications   Prior to Admission medications   Medication Sig Start Date End Date Taking? Authorizing Provider  ciprofloxacin (CIPRO) 500 MG tablet Take 500 mg by mouth 2 (two) times daily. 07/03/13 07/16/13  Historical Provider, MD  diltiazem (CARDIZEM LA) 360 MG 24 hr tablet Take 360 mg by mouth daily.    Historical Provider, MD  ibuprofen (ADVIL,MOTRIN) 200 MG tablet Take 600 mg by mouth every 6 (six) hours as needed for pain.    Historical Provider, MD  magnesium oxide (MAG-OX) 400 MG tablet Take 400 mg by mouth daily.    Historical Provider, MD  meclizine (ANTIVERT) 25 MG tablet Take 25 mg by mouth daily.    Historical Provider, MD  oxyCODONE-acetaminophen (PERCOCET/ROXICET) 5-325 MG per tablet Take 1 tablet by mouth every 6 (six) hours as needed for moderate pain.     Historical Provider, MD  pseudoephedrine-guaifenesin (MUCINEX D) 60-600 MG per tablet Take 1 tablet by mouth every 12 (twelve) hours.    Historical Provider, MD  senna-docusate (SENOKOT-S) 8.6-50 MG per tablet Take 1 tablet by mouth 2 (two) times daily.    Historical Provider, MD  tamsulosin (FLOMAX) 0.4 MG CAPS capsule Take 0.4  mg by mouth daily.    Historical Provider, MD  warfarin (COUMADIN) 10 MG tablet Take 10 mg by mouth See admin instructions. Take 10 mg every Sun, Tues, Thur, Sat    Historical Provider, MD  warfarin (COUMADIN) 7.5 MG tablet Take 7.5 mg by mouth every Monday, Wednesday, and Friday.    Historical Provider, MD   BP 134/67  Pulse 72  Temp(Src) 98.5 F (36.9 C) (Oral)  Ht 5' 11.5" (1.816 m)  Wt 460 lb (208.655 kg)  BMI 63.27 kg/m2  SpO2 97% Physical Exam  Nursing note and  vitals reviewed. Constitutional: He is oriented to person, place, and time. He appears well-developed and well-nourished.  HENT:  Head: Normocephalic.  Eyes: Pupils are equal, round, and reactive to light.  Neck: Neck supple.  Cardiovascular: Normal rate and regular rhythm.  Exam reveals no gallop and no friction rub.   No murmur heard. Pulmonary/Chest: Effort normal. No respiratory distress.  Abdominal: Soft. He exhibits no distension. There is no tenderness.  Musculoskeletal: He exhibits no edema.  Neurological: He is alert and oriented to person, place, and time.  Skin: Skin is warm.  Bilateral incisions on both legs noted. Multiple sutures with minimal erythema of skin and no bleeding signs of infection noted on both incisions.  Right incision has a drain in place with serosangunous fluid  Left incision has had the drain pulled small open area with drainage no obvious signs of infection  Psychiatric: He has a normal mood and affect.    ED Course  Procedures (including critical care time) Labs Review Labs Reviewed - No data to display  Imaging Review No results found.   EKG Interpretation None      MDM   Final diagnoses:  Post-operative state   63 year old male who presents after a drain was pulled from his left skin incision. Patient underwent surgery with plastics for a panniculectomy. Grandma scheduled for removal next week. Incision examined and there is no bleeding minimal drainage from the drain site incision otherwise has no signs of infection. Patient states that he is currently on antibiotics to improve wound healing. Patient is not experiencing any pain in his comfortable on exam.  Patient instructed to contact the surgeon in the morning and to have frequent dressing changes for continued drainage. Patient agrees with this plan and is discharged home    Clement SayresStaci Torrance Frech, MD 07/09/13 2336

## 2013-07-09 NOTE — ED Notes (Signed)
Patient was at PT today and JP drain was accidentally removed from area of panniculectomy.  Small amount of purulent drainage from incision site.  Patient is CAOx3, no pain at this time.

## 2013-07-09 NOTE — ED Notes (Signed)
[facility] called to pick up patient °

## 2013-07-11 NOTE — ED Provider Notes (Signed)
I saw and evaluated the patient, reviewed the resident's note and I agree with the findings and plan.  Patient is a 63 year old male with history of morbid obesity who is 10 days status post surgery for removal of excess skin for his lower abdomen and legs.  He presents with complaints of pulling out a JP drain by accident.  He has no other complaints.  No fevers or chills.  On exam, vitals are stable and he is afebrile.  Head is AT, [place].  Neck is supple.  The JP site in the left groin has slight drainage and granulation tissue.  There is no purulent discharge or surrounding erythema.  No workup appears indicated and I feel follow[facility] in the next few days is all that is indicated.  To return prn.     EKG Interpretation None        Geoffery Lyonsouglas Kevork Joyce, MD 07/11/13 1558

## 2013-07-30 ENCOUNTER — Ambulatory Visit (INDEPENDENT_AMBULATORY_CARE_PROVIDER_SITE_OTHER): Payer: Medicare Other | Admitting: *Deleted

## 2013-07-30 DIAGNOSIS — I4891 Unspecified atrial fibrillation: Secondary | ICD-10-CM

## 2013-07-30 DIAGNOSIS — I482 Chronic atrial fibrillation, unspecified: Secondary | ICD-10-CM

## 2013-07-30 DIAGNOSIS — Z7901 Long term (current) use of anticoagulants: Secondary | ICD-10-CM

## 2013-07-30 DIAGNOSIS — Z5181 Encounter for therapeutic drug level monitoring: Secondary | ICD-10-CM

## 2013-07-30 LAB — POCT INR: INR: 4.8

## 2013-08-04 ENCOUNTER — Ambulatory Visit (INDEPENDENT_AMBULATORY_CARE_PROVIDER_SITE_OTHER): Payer: Medicare Other | Admitting: *Deleted

## 2013-08-04 DIAGNOSIS — I4891 Unspecified atrial fibrillation: Secondary | ICD-10-CM

## 2013-08-04 DIAGNOSIS — Z7901 Long term (current) use of anticoagulants: Secondary | ICD-10-CM

## 2013-08-04 DIAGNOSIS — I482 Chronic atrial fibrillation, unspecified: Secondary | ICD-10-CM

## 2013-08-04 DIAGNOSIS — Z5181 Encounter for therapeutic drug level monitoring: Secondary | ICD-10-CM

## 2013-08-04 LAB — POCT INR: INR: 3.9

## 2013-08-11 ENCOUNTER — Ambulatory Visit (INDEPENDENT_AMBULATORY_CARE_PROVIDER_SITE_OTHER): Payer: Medicare Other | Admitting: *Deleted

## 2013-08-11 DIAGNOSIS — Z5181 Encounter for therapeutic drug level monitoring: Secondary | ICD-10-CM

## 2013-08-11 DIAGNOSIS — Z7901 Long term (current) use of anticoagulants: Secondary | ICD-10-CM

## 2013-08-11 DIAGNOSIS — I4891 Unspecified atrial fibrillation: Secondary | ICD-10-CM

## 2013-08-11 DIAGNOSIS — I482 Chronic atrial fibrillation, unspecified: Secondary | ICD-10-CM

## 2013-08-11 LAB — POCT INR: INR: 2

## 2013-08-12 ENCOUNTER — Telehealth: Payer: Self-pay | Admitting: *Deleted

## 2013-08-12 NOTE — Telephone Encounter (Signed)
Faxed orders r/t INR/warfarin dosing to [facility] [place] [place]

## 2013-08-15 ENCOUNTER — Telehealth: Payer: Self-pay | Admitting: *Deleted

## 2013-08-15 NOTE — Telephone Encounter (Signed)
Faxed HH orders related to PT/INR and warfarin dosing

## 2013-08-18 ENCOUNTER — Ambulatory Visit (INDEPENDENT_AMBULATORY_CARE_PROVIDER_SITE_OTHER): Payer: Medicare Other | Admitting: *Deleted

## 2013-08-18 ENCOUNTER — Telehealth: Payer: Self-pay | Admitting: *Deleted

## 2013-08-18 DIAGNOSIS — I482 Chronic atrial fibrillation, unspecified: Secondary | ICD-10-CM

## 2013-08-18 DIAGNOSIS — Z5181 Encounter for therapeutic drug level monitoring: Secondary | ICD-10-CM

## 2013-08-18 DIAGNOSIS — I4891 Unspecified atrial fibrillation: Secondary | ICD-10-CM

## 2013-08-18 DIAGNOSIS — Z7901 Long term (current) use of anticoagulants: Secondary | ICD-10-CM

## 2013-08-18 LAB — POCT INR: INR: 2

## 2013-08-18 NOTE — Telephone Encounter (Signed)
See coumadin note. 

## 2013-08-18 NOTE — Telephone Encounter (Signed)
inr 2.0 , no missed doses

## 2013-08-20 ENCOUNTER — Other Ambulatory Visit: Payer: Self-pay | Admitting: *Deleted

## 2013-08-20 MED ORDER — DILTIAZEM HCL ER COATED BEADS 360 MG PO TB24: 360.0000 mg | ORAL_TABLET | Freq: Every day | ORAL | Status: AC

## 2013-08-21 ENCOUNTER — Telehealth: Payer: Self-pay | Admitting: *Deleted

## 2013-08-21 ENCOUNTER — Other Ambulatory Visit: Payer: Self-pay | Admitting: Internal Medicine

## 2013-08-21 NOTE — Telephone Encounter (Signed)
Faxed signed orders r/t warfarin and PT/INR check

## 2013-08-22 NOTE — Telephone Encounter (Signed)
Refilled for #30 tablets (360mg ) with 6 refills on 08/20/2013

## 2013-08-25 ENCOUNTER — Ambulatory Visit (INDEPENDENT_AMBULATORY_CARE_PROVIDER_SITE_OTHER): Payer: Medicare Other | Admitting: *Deleted

## 2013-08-25 DIAGNOSIS — Z5181 Encounter for therapeutic drug level monitoring: Secondary | ICD-10-CM

## 2013-08-25 DIAGNOSIS — I4891 Unspecified atrial fibrillation: Secondary | ICD-10-CM

## 2013-08-25 DIAGNOSIS — I482 Chronic atrial fibrillation, unspecified: Secondary | ICD-10-CM

## 2013-08-25 DIAGNOSIS — Z7901 Long term (current) use of anticoagulants: Secondary | ICD-10-CM

## 2013-08-25 LAB — POCT INR: INR: 2.3

## 2013-08-28 ENCOUNTER — Emergency Department (HOSPITAL_COMMUNITY)
Admission: EM | Admit: 2013-08-28 | Discharge: 2013-08-29 | Disposition: A | Discharge status: Home / Self Care | Payer: Medicare Other | Source: Home / Self Care | Attending: Emergency Medicine | Admitting: Emergency Medicine

## 2013-08-28 ENCOUNTER — Encounter (HOSPITAL_COMMUNITY): Payer: Self-pay | Admitting: Emergency Medicine

## 2013-08-28 DIAGNOSIS — L02419 Cutaneous abscess of limb, unspecified: Secondary | ICD-10-CM | POA: Insufficient documentation

## 2013-08-28 DIAGNOSIS — Z87891 Personal history of nicotine dependence: Secondary | ICD-10-CM

## 2013-08-28 DIAGNOSIS — L03116 Cellulitis of left lower limb: Secondary | ICD-10-CM

## 2013-08-28 DIAGNOSIS — E669 Obesity, unspecified: Secondary | ICD-10-CM

## 2013-08-28 DIAGNOSIS — I10 Essential (primary) hypertension: Secondary | ICD-10-CM | POA: Insufficient documentation

## 2013-08-28 DIAGNOSIS — I4891 Unspecified atrial fibrillation: Secondary | ICD-10-CM | POA: Insufficient documentation

## 2013-08-28 DIAGNOSIS — Z79899 Other long term (current) drug therapy: Secondary | ICD-10-CM | POA: Insufficient documentation

## 2013-08-28 DIAGNOSIS — Z86718 Personal history of other venous thrombosis and embolism: Secondary | ICD-10-CM | POA: Insufficient documentation

## 2013-08-28 DIAGNOSIS — Z7901 Long term (current) use of anticoagulants: Secondary | ICD-10-CM

## 2013-08-28 DIAGNOSIS — L03119 Cellulitis of unspecified part of limb: Secondary | ICD-10-CM

## 2013-08-28 DIAGNOSIS — Z9889 Other specified postprocedural states: Secondary | ICD-10-CM

## 2013-08-28 DIAGNOSIS — R112 Nausea with vomiting, unspecified: Secondary | ICD-10-CM | POA: Insufficient documentation

## 2013-08-28 DIAGNOSIS — M79609 Pain in unspecified limb: Secondary | ICD-10-CM | POA: Diagnosis not present

## 2013-08-28 MED ORDER — ONDANSETRON 8 MG PO TBDP
8.0000 mg | ORAL_TABLET | Freq: Once | ORAL | Status: AC
  Administered 2013-08-28: 8 mg via ORAL
  Filled 2013-08-28: qty 1

## 2013-08-28 MED ORDER — SODIUM CHLORIDE 0.9 % IV BOLUS (SEPSIS)
500.0000 mL | Freq: Once | INTRAVENOUS | Status: AC
  Administered 2013-08-28: 500 mL via INTRAVENOUS

## 2013-08-28 MED ORDER — DIAZEPAM 5 MG/ML IJ SOLN
5.0000 mg | Freq: Once | INTRAMUSCULAR | Status: AC
  Administered 2013-08-28: 5 mg via INTRAVENOUS
  Filled 2013-08-28: qty 2

## 2013-08-28 NOTE — ED Provider Notes (Signed)
CSN: [id number]     Arrival date & time 08/28/13  2158 History   First MD Initiated Contact with Patient 08/28/13 2310    This chart was scribed for Hanley Seamen, MD by Marica Otter, ED Scribe. This patient was seen in room APA14/APA14 and the patient's care was started at 11:12 PM.  Chief Complaint  Patient presents with  . Nausea and Vomiting    HPI HPI Comments: [name] is a 63 y.o. male, with a Hx of obesity, HTN, afib, cellulitis, HLD, panniculectomy of the right side, and deep vein thrombosis, who presents to the Emergency Department complaining of nausea and vomiting onset 3 days ago. Pt denies diarrhea. Pt also denies chest pain, however, notes that he has SOB with exertion.   Pt also complains of associated dizziness and describes his dizziness as the room spinning.   Pt also complains of associated abd pain and rates his pain a 5 out of 10.   Past Medical History  Diagnosis Date  . Obesity   . Hypertension   . Atrial fibrillation   . Cellulitis   . Hyperlipidemia   . Deep vein thrombosis     hx of   Past Surgical History  Procedure Laterality Date  . Abdominal surgery    . Panniculectomy     Family History  Problem Relation Age of Onset  . Cancer Mother    History  Substance Use Topics  . Smoking status: Former Smoker    Quit date: 01/02/1993  . Smokeless tobacco: Not on file     Comment: quit 2012  . Alcohol Use: Yes     Comment: occasional    Review of Systems  A complete 10 system review of systems was obtained and all systems are negative except as noted in the HPI and PMH.    Allergies  Strawberry; Adhesive ; Clindamycin; Tetracyclines & related; and Vancomycin  Home Medications   Prior to Admission medications   Medication Sig Start Date End Date Taking? Authorizing Provider  diltiazem (CARDIZEM LA) 360 MG 24 hr tablet Take 1 tablet (360 mg total) by mouth daily. 08/20/13   Chrystie Nose, MD  ibuprofen (ADVIL,MOTRIN) 200 MG tablet  Take 600 mg by mouth every 6 (six) hours as needed for pain.    Historical Provider, MD  magnesium oxide (MAG-OX) 400 MG tablet Take 400 mg by mouth daily.    Historical Provider, MD  meclizine (ANTIVERT) 25 MG tablet Take 25 mg by mouth daily.    Historical Provider, MD  pseudoephedrine-guaifenesin (MUCINEX D) 60-600 MG per tablet Take 1 tablet by mouth every 12 (twelve) hours.    Historical Provider, MD  senna-docusate (SENOKOT-S) 8.6-50 MG per tablet Take 1 tablet by mouth 2 (two) times daily.    Historical Provider, MD  tamsulosin (FLOMAX) 0.4 MG CAPS capsule Take 0.4 mg by mouth daily.    Historical Provider, MD  warfarin (COUMADIN) 10 MG tablet Take 10 mg by mouth See admin instructions. Take 10 mg every Sun, Tues, Thur, Sat    Historical Provider, MD  warfarin (COUMADIN) 7.5 MG tablet Take 7.5 mg by mouth every Monday, Wednesday, and Friday.    Historical Provider, MD   Triage Vitals: BP 128/79  Pulse 74  Temp(Src) 98.9 F (37.2 C) (Oral)  Resp 24  Ht  (1.803 m)  Wt 460 lb (208.655 kg)  BMI 64.19 kg/m2  SpO2 94% Physical Exam General: Well-developed, well-nourished male in no acute distress; morbidly obese;  appearance consistent with age of record HENT: normocephalic; atraumatic Eyes: pupils equal, round and reactive to light; extraocular muscles intact Neck: supple Heart: irregular rhythm Lungs: clear to auscultation bilaterally Abdomen: soft; obese; mild periumbilical tenderness; bowel sounds present Extremities: No deformity; full range of motion; pulses normal; edema of lower legs with left greater than right Neurologic: Awake, alert and oriented; motor function intact in all extremities and symmetric; no facial droop Skin: Warm and dry; erythema of the medial left thigh  Psychiatric: Normal mood and affect  ED Course  Procedures (including critical care time) DIAGNOSTIC STUDIES: Oxygen Saturation is 94% on RA, adequate by my interpretation.    COORDINATION OF  CARE: 11:17 PM-Discussed treatment plan which includes labs with pt at bedside and pt agreed to plan.    MDM   Nursing notes and vitals signs, including pulse oximetry, reviewed.  Summary of this visit's results, reviewed by myself:  Labs:  Results for orders placed during the hospital encounter of 08/28/13 (from the past 24 hour(s))  CBC WITH DIFFERENTIAL     Status: Abnormal   Collection Time    08/28/13 11:20 PM      Result Value Ref Range   WBC 13.8 (*) 4.0 - 10.5 K/uL   RBC 5.04  4.22 - 5.81 MIL/uL   Hemoglobin 15.2  13.0 - 17.0 g/dL   HCT 16.1  09.6 - 04.5 %   MCV 85.7  78.0 - 100.0 fL   MCH 30.2  26.0 - 34.0 pg   MCHC 35.2  30.0 - 36.0 g/dL   RDW 40.9  81.1 - 91.4 %   Platelets 155  150 - 400 K/uL   Neutrophils Relative % 86 (*) 43 - 77 %   Neutro Abs 12.0 (*) 1.7 - 7.7 K/uL   Lymphocytes Relative 8 (*) 12 - 46 %   Lymphs Abs 1.1  0.7 - 4.0 K/uL   Monocytes Relative 6  3 - 12 %   Monocytes Absolute 0.8  0.1 - 1.0 K/uL   Eosinophils Relative 0  0 - 5 %   Eosinophils Absolute 0.0  0.0 - 0.7 K/uL   Basophils Relative 0  0 - 1 %   Basophils Absolute 0.0  0.0 - 0.1 K/uL  COMPREHENSIVE METABOLIC PANEL     Status: Abnormal   Collection Time    08/28/13 11:20 PM      Result Value Ref Range   Sodium 132 (*) 137 - 147 mEq/L   Potassium 3.9  3.7 - 5.3 mEq/L   Chloride 96  96 - 112 mEq/L   CO2 24  19 - 32 mEq/L   Glucose, Bld 149 (*) 70 - 99 mg/dL   BUN 15  6 - 23 mg/dL   Creatinine, Ser 7.82  0.50 - 1.35 mg/dL   Calcium 8.7  8.4 - 95.6 mg/dL   Total Protein 6.9  6.0 - 8.3 g/dL   Albumin 3.0 (*) 3.5 - 5.2 g/dL   AST 16  0 - 37 U/L   ALT 10  0 - 53 U/L   Alkaline Phosphatase 57  39 - 117 U/L   Total Bilirubin 1.1  0.3 - 1.2 mg/dL   GFR calc non Af Amer 85 (*) >90 mL/min   GFR calc Af Amer >90  >90 mL/min   Anion gap 12  5 - 15  PROTIME-INR     Status: Abnormal   Collection Time    08/28/13 11:20 PM      Result  Value Ref Range   Prothrombin Time 26.2 (*) 11.6 -  15.2 seconds   INR 2.40 (*) 0.00 - 1.49  URINALYSIS, ROUTINE W REFLEX MICROSCOPIC     Status: Abnormal   Collection Time    08/28/13 11:50 PM      Result Value Ref Range   Color, Urine ORANGE (*) YELLOW   APPearance CLEAR  CLEAR   Specific Gravity, Urine 1.020  1.005 - 1.030   pH 6.0  5.0 - 8.0   Glucose, UA NEGATIVE  NEGATIVE mg/dL   Hgb urine dipstick TRACE (*) NEGATIVE   Bilirubin Urine NEGATIVE  NEGATIVE   Ketones, ur NEGATIVE  NEGATIVE mg/dL   Protein, ur 30 (*) NEGATIVE mg/dL   Urobilinogen, UA 0.2  0.0 - 1.0 mg/dL   Nitrite NEGATIVE  NEGATIVE   Leukocytes, UA NEGATIVE  NEGATIVE  URINE MICROSCOPIC-ADD ON     Status: Abnormal   Collection Time    08/28/13 11:50 PM      Result Value Ref Range   Squamous Epithelial / LPF MANY (*) RARE   WBC, UA 3-6  <3 WBC/hpf   RBC / HPF 0-2  <3 RBC/hpf   Bacteria, UA MANY (*) RARE   Urine-Other MUCOUS PRESENT     1:14 AM Patient tolerated fluids without emesis. Will treat his left thigh cellulitis with Levaquin.  N/V may be due to vertigo. Patient rehydrated. Will treat with Valium as meclizine was not effective this time.   I personally performed the services described in this documentation, which was scribed in my presence. The recorded information has been reviewed and is accurate.   Hanley Seamen, MD 08/29/13 [phone]

## 2013-08-28 NOTE — ED Notes (Signed)
Having nausea and vomiting for 3 days.

## 2013-08-29 LAB — CBC WITH DIFFERENTIAL/PLATELET
BASOS ABS: 0 10*3/uL (ref 0.0–0.1)
Basophils Relative: 0 % (ref 0–1)
EOS ABS: 0 10*3/uL (ref 0.0–0.7)
EOS PCT: 0 % (ref 0–5)
HCT: 43.2 % (ref 39.0–52.0)
Hemoglobin: 15.2 g/dL (ref 13.0–17.0)
Lymphocytes Relative: 8 % — ABNORMAL LOW (ref 12–46)
Lymphs Abs: 1.1 10*3/uL (ref 0.7–4.0)
MCH: 30.2 pg (ref 26.0–34.0)
MCHC: 35.2 g/dL (ref 30.0–36.0)
MCV: 85.7 fL (ref 78.0–100.0)
Monocytes Absolute: 0.8 10*3/uL (ref 0.1–1.0)
Monocytes Relative: 6 % (ref 3–12)
Neutro Abs: 12 10*3/uL — ABNORMAL HIGH (ref 1.7–7.7)
Neutrophils Relative %: 86 % — ABNORMAL HIGH (ref 43–77)
Platelets: 155 10*3/uL (ref 150–400)
RBC: 5.04 MIL/uL (ref 4.22–5.81)
RDW: 14.3 % (ref 11.5–15.5)
WBC: 13.8 10*3/uL — ABNORMAL HIGH (ref 4.0–10.5)

## 2013-08-29 LAB — COMPREHENSIVE METABOLIC PANEL
ALBUMIN: 3 g/dL — AB (ref 3.5–5.2)
ALT: 10 U/L (ref 0–53)
ANION GAP: 12 (ref 5–15)
AST: 16 U/L (ref 0–37)
Alkaline Phosphatase: 57 U/L (ref 39–117)
BUN: 15 mg/dL (ref 6–23)
CALCIUM: 8.7 mg/dL (ref 8.4–10.5)
CO2: 24 mEq/L (ref 19–32)
Chloride: 96 mEq/L (ref 96–112)
Creatinine, Ser: 0.97 mg/dL (ref 0.50–1.35)
GFR calc Af Amer: >90 mL/min (ref >90)
GFR calc non Af Amer: 85 mL/min — ABNORMAL LOW (ref >90)
Glucose, Bld: 149 mg/dL — ABNORMAL HIGH (ref 70–99)
Potassium: 3.9 mEq/L (ref 3.7–5.3)
Sodium: 132 mEq/L — ABNORMAL LOW (ref 137–147)
Total Bilirubin: 1.1 mg/dL (ref 0.3–1.2)
Total Protein: 6.9 g/dL (ref 6.0–8.3)

## 2013-08-29 LAB — URINALYSIS, ROUTINE W REFLEX MICROSCOPIC
Bilirubin Urine: NEGATIVE
Glucose, UA: NEGATIVE mg/dL
KETONES UR: NEGATIVE mg/dL
LEUKOCYTES UA: NEGATIVE
NITRITE: NEGATIVE
Protein, ur: 30 mg/dL — AB
Specific Gravity, Urine: 1.02 (ref 1.005–1.030)
Urobilinogen, UA: 0.2 mg/dL (ref 0.0–1.0)
pH: 6 (ref 5.0–8.0)

## 2013-08-29 LAB — PROTIME-INR
INR: 2.4 — AB (ref 0.00–1.49)
Prothrombin Time: 26.2 seconds — ABNORMAL HIGH (ref 11.6–15.2)

## 2013-08-29 LAB — URINE MICROSCOPIC-ADD ON

## 2013-08-29 MED ORDER — LEVOFLOXACIN 750 MG PO TABS
750.0000 mg | ORAL_TABLET | Freq: Once | ORAL | Status: AC
  Administered 2013-08-29: 750 mg via ORAL
  Filled 2013-08-29: qty 1

## 2013-08-29 MED ORDER — LEVOFLOXACIN 750 MG PO TABS: 750.0000 mg | ORAL_TABLET | Freq: Every day | ORAL | Status: DC

## 2013-08-29 MED ORDER — DIAZEPAM 5 MG PO TABS: 5.0000 mg | ORAL_TABLET | Freq: Four times a day (QID) | ORAL | Status: DC | PRN

## 2013-08-29 NOTE — ED Notes (Signed)
Patient drinking fluid with no difficulty. No further complaints of nausea. No noted vomiting since arrival to ED.

## 2013-08-29 NOTE — ED Notes (Signed)
Gave patient diet ginger ale to drink as requested.

## 2013-08-30 ENCOUNTER — Inpatient Hospital Stay (HOSPITAL_COMMUNITY): Payer: Medicare Other

## 2013-08-30 ENCOUNTER — Encounter (HOSPITAL_COMMUNITY): Payer: Self-pay | Admitting: Emergency Medicine

## 2013-08-30 ENCOUNTER — Inpatient Hospital Stay (HOSPITAL_COMMUNITY)
Admission: EM | Admit: 2013-08-30 | Discharge: 2013-09-04 | DRG: 603 | Disposition: A | Discharge status: Home Health | Payer: Medicare Other | Attending: Family Medicine | Admitting: Family Medicine

## 2013-08-30 DIAGNOSIS — Z7901 Long term (current) use of anticoagulants: Secondary | ICD-10-CM

## 2013-08-30 DIAGNOSIS — Z66 Do not resuscitate: Secondary | ICD-10-CM | POA: Diagnosis present

## 2013-08-30 DIAGNOSIS — L03116 Cellulitis of left lower limb: Secondary | ICD-10-CM

## 2013-08-30 DIAGNOSIS — G4733 Obstructive sleep apnea (adult) (pediatric): Secondary | ICD-10-CM | POA: Diagnosis present

## 2013-08-30 DIAGNOSIS — L538 Other specified erythematous conditions: Secondary | ICD-10-CM | POA: Diagnosis present

## 2013-08-30 DIAGNOSIS — I4891 Unspecified atrial fibrillation: Secondary | ICD-10-CM | POA: Diagnosis present

## 2013-08-30 DIAGNOSIS — L02619 Cutaneous abscess of unspecified foot: Secondary | ICD-10-CM | POA: Diagnosis present

## 2013-08-30 DIAGNOSIS — Z881 Allergy status to other antibiotic agents status: Secondary | ICD-10-CM | POA: Diagnosis not present

## 2013-08-30 DIAGNOSIS — Z86718 Personal history of other venous thrombosis and embolism: Secondary | ICD-10-CM

## 2013-08-30 DIAGNOSIS — L03119 Cellulitis of unspecified part of limb: Principal | ICD-10-CM

## 2013-08-30 DIAGNOSIS — E785 Hyperlipidemia, unspecified: Secondary | ICD-10-CM | POA: Diagnosis present

## 2013-08-30 DIAGNOSIS — I482 Chronic atrial fibrillation, unspecified: Secondary | ICD-10-CM

## 2013-08-30 DIAGNOSIS — I10 Essential (primary) hypertension: Secondary | ICD-10-CM | POA: Diagnosis present

## 2013-08-30 DIAGNOSIS — Z6841 Body Mass Index (BMI) 40.0 and over, adult: Secondary | ICD-10-CM | POA: Diagnosis not present

## 2013-08-30 DIAGNOSIS — Z87891 Personal history of nicotine dependence: Secondary | ICD-10-CM | POA: Diagnosis not present

## 2013-08-30 DIAGNOSIS — L02419 Cutaneous abscess of limb, unspecified: Principal | ICD-10-CM

## 2013-08-30 DIAGNOSIS — I4729 Other ventricular tachycardia: Secondary | ICD-10-CM | POA: Diagnosis not present

## 2013-08-30 DIAGNOSIS — I472 Ventricular tachycardia, unspecified: Secondary | ICD-10-CM | POA: Diagnosis not present

## 2013-08-30 DIAGNOSIS — L0390 Cellulitis, unspecified: Secondary | ICD-10-CM | POA: Diagnosis present

## 2013-08-30 DIAGNOSIS — M79609 Pain in unspecified limb: Secondary | ICD-10-CM | POA: Diagnosis present

## 2013-08-30 LAB — COMPREHENSIVE METABOLIC PANEL
ALT: 9 U/L (ref 0–53)
AST: 13 U/L (ref 0–37)
Albumin: 2.7 g/dL — ABNORMAL LOW (ref 3.5–5.2)
Alkaline Phosphatase: 58 U/L (ref 39–117)
Anion gap: 8 (ref 5–15)
BUN: 15 mg/dL (ref 6–23)
CALCIUM: 8.6 mg/dL (ref 8.4–10.5)
CO2: 30 meq/L (ref 19–32)
CREATININE: 0.86 mg/dL (ref 0.50–1.35)
Chloride: 99 mEq/L (ref 96–112)
GFR calc Af Amer: >90 mL/min (ref >90)
GFR, EST NON AFRICAN AMERICAN: 90 mL/min — AB (ref >90)
GLUCOSE: 155 mg/dL — AB (ref 70–99)
Potassium: 4 mEq/L (ref 3.7–5.3)
Sodium: 137 mEq/L (ref 137–147)
Total Bilirubin: 0.6 mg/dL (ref 0.3–1.2)
Total Protein: 6.7 g/dL (ref 6.0–8.3)

## 2013-08-30 LAB — CBC WITH DIFFERENTIAL/PLATELET
Basophils Absolute: 0 10*3/uL (ref 0.0–0.1)
Basophils Relative: 0 % (ref 0–1)
EOS PCT: 2 % (ref 0–5)
Eosinophils Absolute: 0.1 10*3/uL (ref 0.0–0.7)
HCT: 41.6 % (ref 39.0–52.0)
Hemoglobin: 14.2 g/dL (ref 13.0–17.0)
LYMPHS ABS: 1 10*3/uL (ref 0.7–4.0)
LYMPHS PCT: 12 % (ref 12–46)
MCH: 29.6 pg (ref 26.0–34.0)
MCHC: 34.1 g/dL (ref 30.0–36.0)
MCV: 86.8 fL (ref 78.0–100.0)
MONO ABS: 0.9 10*3/uL (ref 0.1–1.0)
Monocytes Relative: 11 % (ref 3–12)
Neutro Abs: 6.1 10*3/uL (ref 1.7–7.7)
Neutrophils Relative %: 75 % (ref 43–77)
Platelets: 131 10*3/uL — ABNORMAL LOW (ref 150–400)
RBC: 4.79 MIL/uL (ref 4.22–5.81)
RDW: 14 % (ref 11.5–15.5)
WBC: 8.1 10*3/uL (ref 4.0–10.5)

## 2013-08-30 LAB — PROTIME-INR
INR: 2.29 — AB (ref 0.00–1.49)
PROTHROMBIN TIME: 25.2 s — AB (ref 11.6–15.2)

## 2013-08-30 LAB — PRO B NATRIURETIC PEPTIDE: Pro B Natriuretic peptide (BNP): 1049 pg/mL — ABNORMAL HIGH (ref 0–125)

## 2013-08-30 LAB — MRSA PCR SCREENING: MRSA by PCR: POSITIVE — AB

## 2013-08-30 LAB — TROPONIN I

## 2013-08-30 MED ORDER — ONDANSETRON HCL 4 MG/2ML IJ SOLN
4.0000 mg | Freq: Once | INTRAMUSCULAR | Status: AC
  Administered 2013-08-30: 4 mg via INTRAVENOUS
  Filled 2013-08-30: qty 2

## 2013-08-30 MED ORDER — ACETAMINOPHEN 650 MG RE SUPP: 650.0000 mg | Freq: Four times a day (QID) | RECTAL | Status: DC | PRN

## 2013-08-30 MED ORDER — WARFARIN SODIUM 10 MG PO TABS: 10.0000 mg | ORAL_TABLET | ORAL | Status: DC

## 2013-08-30 MED ORDER — SODIUM CHLORIDE 0.9 % IJ SOLN: 3.0000 mL | INTRAMUSCULAR | Status: DC | PRN

## 2013-08-30 MED ORDER — ACETAMINOPHEN 325 MG PO TABS
650.0000 mg | ORAL_TABLET | Freq: Four times a day (QID) | ORAL | Status: DC | PRN
  Administered 2013-09-03: 650 mg via ORAL
  Filled 2013-08-30: qty 2

## 2013-08-30 MED ORDER — LINEZOLID 2 MG/ML IV SOLN
600.0000 mg | Freq: Once | INTRAVENOUS | Status: AC
  Administered 2013-08-30: 600 mg via INTRAVENOUS
  Filled 2013-08-30: qty 300

## 2013-08-30 MED ORDER — SODIUM CHLORIDE 0.9 % IV SOLN
600.0000 mg | Freq: Two times a day (BID) | INTRAVENOUS | Status: DC
  Administered 2013-08-30 – 2013-09-04 (×10): 600 mg via INTRAVENOUS
  Filled 2013-08-30 (×11): qty 600

## 2013-08-30 MED ORDER — CHLORHEXIDINE GLUCONATE CLOTH 2 % EX PADS
6.0000 | MEDICATED_PAD | Freq: Every day | CUTANEOUS | Status: AC
  Administered 2013-08-31 – 2013-09-04 (×5): 6 via TOPICAL

## 2013-08-30 MED ORDER — MAGNESIUM OXIDE 400 (241.3 MG) MG PO TABS
400.0000 mg | ORAL_TABLET | Freq: Every day | ORAL | Status: DC
  Administered 2013-08-31 – 2013-09-04 (×5): 400 mg via ORAL
  Filled 2013-08-30 (×5): qty 1

## 2013-08-30 MED ORDER — MUPIROCIN 2 % EX OINT
1.0000 "application " | TOPICAL_OINTMENT | Freq: Two times a day (BID) | CUTANEOUS | Status: AC
  Administered 2013-08-30 – 2013-09-04 (×10): 1 via NASAL
  Filled 2013-08-30 (×2): qty 22

## 2013-08-30 MED ORDER — MECLIZINE HCL 12.5 MG PO TABS
25.0000 mg | ORAL_TABLET | Freq: Every day | ORAL | Status: DC
  Administered 2013-08-31 – 2013-09-04 (×5): 25 mg via ORAL
  Filled 2013-08-30 (×5): qty 2

## 2013-08-30 MED ORDER — SODIUM CHLORIDE 0.9 % IJ SOLN
3.0000 mL | Freq: Two times a day (BID) | INTRAMUSCULAR | Status: DC
  Administered 2013-08-30 – 2013-09-04 (×8): 3 mL via INTRAVENOUS

## 2013-08-30 MED ORDER — ONDANSETRON HCL 4 MG PO TABS: 4.0000 mg | ORAL_TABLET | Freq: Four times a day (QID) | ORAL | Status: DC | PRN

## 2013-08-30 MED ORDER — PIPERACILLIN-TAZOBACTAM 3.375 G IVPB
3.3750 g | Freq: Once | INTRAVENOUS | Status: AC
  Administered 2013-08-30: 3.375 g via INTRAVENOUS
  Filled 2013-08-30: qty 50

## 2013-08-30 MED ORDER — DEXTROSE 5 % IV SOLN
5.0000 mg/h | Freq: Once | INTRAVENOUS | Status: AC
  Administered 2013-08-30: 5 mg/h via INTRAVENOUS
  Filled 2013-08-30: qty 100

## 2013-08-30 MED ORDER — ONDANSETRON HCL 4 MG/2ML IJ SOLN: 4.0000 mg | Freq: Four times a day (QID) | INTRAMUSCULAR | Status: DC | PRN

## 2013-08-30 MED ORDER — DIAZEPAM 5 MG PO TABS: 5.0000 mg | ORAL_TABLET | Freq: Four times a day (QID) | ORAL | Status: DC | PRN

## 2013-08-30 MED ORDER — SODIUM CHLORIDE 0.9 % IV SOLN
250.0000 mL | INTRAVENOUS | Status: DC | PRN
  Administered 2013-08-31: 250 mL via INTRAVENOUS

## 2013-08-30 MED ORDER — DILTIAZEM HCL ER COATED BEADS 180 MG PO CP24
360.0000 mg | ORAL_CAPSULE | Freq: Every day | ORAL | Status: DC
  Administered 2013-08-31 – 2013-09-04 (×5): 360 mg via ORAL
  Filled 2013-08-30 (×5): qty 2

## 2013-08-30 MED ORDER — LEVALBUTEROL HCL 0.63 MG/3ML IN NEBU
0.6300 mg | INHALATION_SOLUTION | Freq: Four times a day (QID) | RESPIRATORY_TRACT | Status: DC | PRN
  Administered 2013-09-01: 0.63 mg via RESPIRATORY_TRACT
  Filled 2013-08-30: qty 3

## 2013-08-30 MED ORDER — WARFARIN - PHARMACIST DOSING INPATIENT: Status: DC

## 2013-08-30 MED ORDER — CEFTAROLINE FOSAMIL 600 MG IV SOLR
INTRAVENOUS | Status: AC
  Filled 2013-08-30: qty 600

## 2013-08-30 MED ORDER — MORPHINE SULFATE 4 MG/ML IJ SOLN
4.0000 mg | Freq: Once | INTRAMUSCULAR | Status: AC
  Administered 2013-08-30: 4 mg via INTRAVENOUS
  Filled 2013-08-30: qty 1

## 2013-08-30 MED ORDER — DILTIAZEM HCL 100 MG IV SOLR
5.0000 mg/h | INTRAVENOUS | Status: DC
  Administered 2013-08-31: 5 mg/h via INTRAVENOUS
  Filled 2013-08-30: qty 100

## 2013-08-30 MED ORDER — WARFARIN SODIUM 7.5 MG PO TABS: 7.5000 mg | ORAL_TABLET | ORAL | Status: DC

## 2013-08-30 MED ORDER — HYDROCODONE-ACETAMINOPHEN 10-325 MG PO TABS
1.0000 | ORAL_TABLET | Freq: Four times a day (QID) | ORAL | Status: DC | PRN
  Administered 2013-08-30 – 2013-09-03 (×4): 1 via ORAL
  Filled 2013-08-30 (×4): qty 1

## 2013-08-30 MED ORDER — DILTIAZEM HCL 25 MG/5ML IV SOLN
10.0000 mg | Freq: Once | INTRAVENOUS | Status: AC
  Administered 2013-08-30: 10 mg via INTRAVENOUS

## 2013-08-30 NOTE — ED Provider Notes (Signed)
CSN: [id number]     Arrival date & time 08/30/13  1128 History  This chart was scribed for Glynn Octave, MD by Elon Spanner, ED Scribe. This patient was seen in room APA11/APA11 and the patient's care was started at 12:06 PM.    Chief Complaint  Patient presents with  . Leg Swelling    The history is provided by the patient. No language interpreter was used.    HPI Comments: [name] is a 63 y.o. male with a history of obesity, cellulitis, HTN, who presents to the Emergency Department complaining of gradually worsening right leg swelling associated severe pain onset 2 days ago.  Patient reports a subjective fever.  Patient reports he began a course of Levaquin 2 days ago after being seen in the ED for an unrelated and resolved complaint of abdominal pain. Patient denies history of MI, stenting, kidney problems.  Patient denies CP, injury, back pain, abdominal pain, penile/scrotal pain.  PCP: Phillips Odor Past Medical History  Diagnosis Date  . Obesity   . Hypertension   . Atrial fibrillation   . Cellulitis   . Hyperlipidemia   . Deep vein thrombosis     hx of   Past Surgical History  Procedure Laterality Date  . Abdominal surgery    . Panniculectomy     Family History  Problem Relation Age of Onset  . Cancer Mother    History  Substance Use Topics  . Smoking status: Former Smoker    Quit date: 01/02/1993  . Smokeless tobacco: Not on file     Comment: quit 2012  . Alcohol Use: No     Comment: occasional    Review of Systems    Allergies  Strawberry; Adhesive ; Clindamycin; Tetracyclines & related; and Vancomycin  Home Medications   Prior to Admission medications   Medication Sig Start Date End Date Taking? Authorizing Provider  diazepam (VALIUM) 5 MG tablet Take 1 tablet (5 mg total) by mouth every 6 (six) hours as needed (dizzines). 08/29/13  Yes John L Molpus, MD  diltiazem (CARDIZEM LA) 360 MG 24 hr tablet Take 1 tablet (360 mg total) by mouth daily.  08/20/13  Yes Chrystie Nose, MD  ibuprofen (ADVIL,MOTRIN) 200 MG tablet Take 600 mg by mouth every 6 (six) hours as needed for pain.   Yes Historical Provider, MD  levofloxacin (LEVAQUIN) 750 MG tablet Take 1 tablet (750 mg total) by mouth daily. X 7 days 08/29/13  Yes John L Molpus, MD  magnesium oxide (MAG-OX) 400 MG tablet Take 400 mg by mouth daily.   Yes Historical Provider, MD  meclizine (ANTIVERT) 25 MG tablet Take 25 mg by mouth daily.   Yes Historical Provider, MD  warfarin (COUMADIN) 10 MG tablet Take 10 mg by mouth See admin instructions. Take 10 mg every Sun, Tues, Thur, Sat   Yes Historical Provider, MD  warfarin (COUMADIN) 7.5 MG tablet Take 7.5 mg by mouth every Monday, Wednesday, and Friday.   Yes Historical Provider, MD   BP 145/67  Pulse 40  Temp(Src) 97.5 F (36.4 C) (Oral)  Resp 24  Ht  (1.803 m)  Wt 460 lb (208.655 kg)  BMI 64.19 kg/m2  SpO2 91% Physical Exam  Nursing note and vitals reviewed. Constitutional: He is oriented to person, place, and time. He appears well-developed and well-nourished. No distress.  HENT:  Head: Normocephalic and atraumatic.  Mouth/Throat: Oropharynx is clear and moist. No oropharyngeal exudate.  Eyes: Conjunctivae and EOM are normal. Pupils  are equal, round, and reactive to light.  Neck: Normal range of motion. Neck supple.  No meningismus.  Cardiovascular: Normal rate, normal heart sounds and intact distal pulses.   No murmur heard. Intact DP/PT pulses.   Irregular rhythm  Pulmonary/Chest: Effort normal. No respiratory distress. He has wheezes.  Abdominal: Soft. There is no tenderness. There is no rebound and no guarding.  Genitourinary:  Testicles nontender.   Musculoskeletal: Normal range of motion. He exhibits no edema and no tenderness.  Neurological: He is alert and oriented to person, place, and time. No cranial nerve deficit. He exhibits normal muscle tone. Coordination normal.  No ataxia on finger to nose  bilaterally. No pronator drift. 5/5 strength throughout. CN 2-12 intact. Negative Romberg. Equal grip strength. Sensation intact. Gait is normal.   Skin: Skin is warm.  Erythema and edema to the dorsal left foot and distal tibia and left medial thigh.  Healing surgical scars below panus.  No erythema of panus or scrotum.    Psychiatric: He has a normal mood and affect. His behavior is normal.    ED Course  Procedures (including critical care time)  DIAGNOSTIC STUDIES: Oxygen Saturation is 96% on RA, normal by my interpretation.    COORDINATION OF CARE:    Labs Review Labs Reviewed  CBC WITH DIFFERENTIAL - Abnormal; Notable for the following:    Platelets 131 (*)    All other components within normal limits  COMPREHENSIVE METABOLIC PANEL - Abnormal; Notable for the following:    Glucose, Bld 155 (*)    Albumin 2.7 (*)    GFR calc non Af Amer 90 (*)    All other components within normal limits  PROTIME-INR - Abnormal; Notable for the following:    Prothrombin Time 25.2 (*)    INR 2.29 (*)    All other components within normal limits  CULTURE, BLOOD (ROUTINE X 2)  CULTURE, BLOOD (ROUTINE X 2)  MRSA PCR SCREENING    Imaging Review No results found.   EKG Interpretation   Date/Time:  Saturday August 30 2013 14:36:56 EDT Ventricular Rate:  124 PR Interval:    QRS Duration: 109 QT Interval:  355 QTC Calculation: 510 R Axis:   122 Text Interpretation:  Atrial fibrillation Left posterior fascicular block  Prolonged QT interval No significant change was found Confirmed by Manus Gunning   MD, Senya Hinzman [phone]) on 08/30/2013 2:43:23 PM      MDM   Final diagnoses:  Cellulitis of left lower extremity  Worsening LLE cellulitis, on levaquin x 2 days. Subjective fevers.  Pulses intact.  Cellulitis of dorsal foot, ankle, medial thigh, not involving pannus or scrotum. IV antbiotics, cultures. Allergies d/w pharmacy.  Will give linezolid and zosyn. INR therapeutic.  Doubt PE or  DVT.  Patient developed Afib with RVR while in the ED, Rate 120-130s.  Denies CP or SOB.  Cardizem gtt started. Hx Afib in past.  On coumadin.  Care escalated to stepdown bed.  D/w Dr. Kerry Hough.  CRITICAL CARE Performed by: Glynn Octave Total critical care time: 30 Critical care time was exclusive of separately billable procedures and treating other patients. Critical care was necessary to treat or prevent imminent or life-threatening deterioration. Critical care was time spent personally by me on the following activities: development of treatment plan with patient and/or surrogate as well as nursing, discussions with consultants, evaluation of patient's response to treatment, examination of patient, obtaining history from patient or surrogate, ordering and performing treatments and interventions, ordering and review  of laboratory studies, ordering and review of radiographic studies, pulse oximetry and re-evaluation of patient's condition.   Glynn Octave, MD 08/30/13 [phone]

## 2013-08-30 NOTE — Progress Notes (Signed)
Pt appeared to be have decreased breath sounds with out wheezes at time of assessment. He was questioned about if he wore CPAP or oxygen at HS and he stated no. He does have some decrease SaO2 when he is asleep but for most part appears very comfortable. Will continue to monitor.

## 2013-08-30 NOTE — Progress Notes (Signed)
  ANTICOAGULATION CONSULT NOTE - Initial Consult  Pharmacy Consult for Warfarin Indication: atrial fibrillation  Allergies  Allergen Reactions  . Strawberry Hives  . Adhesive  [Tape] Rash    Paper tape OK.  . Clindamycin Rash    Raw skin.  . Tetracyclines & Related Rash  . Vancomycin Rash    Patient Measurements: Height:  (180.3 cm) Weight: 460 lb (208.655 kg) IBW/kg (Calculated) : 75.3  Vital Signs: Temp: 97.5 F (36.4 C) (08/29 1549) Temp src: Oral (08/29 1549) BP: 119/60 mmHg (08/29 1830) Pulse Rate: 60 (08/29 1830)  Labs:  Recent Labs  08/28/13 2320 08/30/13 1224  HGB 15.2 14.2  HCT 43.2 41.6  PLT 155 131*  LABPROT 26.2* 25.2*  INR 2.40* 2.29*  CREATININE 0.97 0.86    Estimated Creatinine Clearance: 158 ml/min (by C-G formula based on Cr of 0.86).   Medical History: Past Medical History  Diagnosis Date  . Obesity   . Hypertension   . Atrial fibrillation   . Cellulitis   . Hyperlipidemia   . Deep vein thrombosis     hx of    Medications:  Prescriptions prior to admission  Medication Sig Dispense Refill  . diazepam (VALIUM) 5 MG tablet Take 1 tablet (5 mg total) by mouth every 6 (six) hours as needed (dizzines).  20 tablet  0  . diltiazem (CARDIZEM LA) 360 MG 24 hr tablet Take 1 tablet (360 mg total) by mouth daily.  30 tablet  6  . ibuprofen (ADVIL,MOTRIN) 200 MG tablet Take 600 mg by mouth every 6 (six) hours as needed for pain.      Marland Kitchen levofloxacin (LEVAQUIN) 750 MG tablet Take 1 tablet (750 mg total) by mouth daily. X 7 days  7 tablet  0  . magnesium oxide (MAG-OX) 400 MG tablet Take 400 mg by mouth daily.      . meclizine (ANTIVERT) 25 MG tablet Take 25 mg by mouth daily.      Marland Kitchen warfarin (COUMADIN) 10 MG tablet Take 10 mg by mouth See admin instructions. Take 10 mg every Sun, Tues, Thur, Sat      . warfarin (COUMADIN) 7.5 MG tablet Take 7.5 mg by mouth every Monday, Wednesday, and Friday.        Assessment: Okay for Protocol, warfarin  for Afib.  INR therapeutic and patient has received dose today per home med list.  Goal of Therapy:  INR 2-3   Plan:  Daily PT/INR.  Mady Gemma 08/30/2013,7:11 PM

## 2013-08-30 NOTE — ED Notes (Signed)
Pt reports seen for same on Thursday and dx with cellulitis. Pt reports swelling and redness has spread to left foot. Pt denies any sob/cp, fevers. Moderate redness and swelling noted to left foot. Pt denies any recent or new injury.

## 2013-08-30 NOTE — ED Notes (Signed)
Pt changed into gown, cleaned area to right groin-with redness and irritation due to yeast

## 2013-08-30 NOTE — ED Notes (Signed)
Pt dx with cellulitis to left lower leg Thursday by PCP and was started on antibiotics, states cellulitis has spread to left foot, c/o severe pain, site very warm to touch and red, pt denies fever

## 2013-08-30 NOTE — H&P (Signed)
[facility] History and Physical  ANTWIONE PICOTTE EAV:[id number] DOB: 12-10-1950 DOA: 08/30/2013  Referring physician: Dr. Manus Gunning, ER physician PCP: Colette Ribas, MD   Chief Complaint: cellulitis  HPI: TYQUAVIOUS GAMEL is a 63 y.o. male who is morbidly obese and has chronic atrial fibrillation on Coumadin. Patient reports that he initially had nausea and vomiting for approximately 3 days when he came to the emergency room a few days ago. At that time, was noted that he may have some mild erythema developing in his left lower extremity and he was prescribed a course of levofloxacin. Patient took his antibiotics at home, but felt that his cellulitis continued to worsen. He did feel feverish at home. He's not noticed any draining ulcers in his lower extremity. He's not had a recent trauma to the area. He noted that his leg was increasingly edematous, erythematous painful. He presents to the ER for evaluation where it was felt that he may have a worsening cellulitis. He denies any other symptoms such as worsening shortness of breath, chest pain, cough, dysuria, diarrhea or any other complaints. He's being admitted for further treatments   Review of Systems:  Pertinent positives as per HPI, otherwise negative   Past Medical History  Diagnosis Date  . Obesity   . Hypertension   . Atrial fibrillation   . Cellulitis   . Hyperlipidemia   . Deep vein thrombosis     hx of   Past Surgical History  Procedure Laterality Date  . Abdominal surgery    . Panniculectomy     Social History:  reports that he quit smoking about 20 years ago. He does not have any smokeless tobacco history on file. He reports that he does not drink alcohol or use illicit drugs.  Allergies  Allergen Reactions  . Strawberry Hives  . Adhesive  [Tape] Rash    Paper tape OK.  . Clindamycin Rash    Raw skin.  . Tetracyclines & Related Rash  . Vancomycin Rash    Family History  Problem Relation Age of  Onset  . Cancer Mother      Prior to Admission medications   Medication Sig Start Date End Date Taking? Authorizing Provider  diazepam (VALIUM) 5 MG tablet Take 1 tablet (5 mg total) by mouth every 6 (six) hours as needed (dizzines). 08/29/13  Yes John L Molpus, MD  diltiazem (CARDIZEM LA) 360 MG 24 hr tablet Take 1 tablet (360 mg total) by mouth daily. 08/20/13  Yes Chrystie Nose, MD  ibuprofen (ADVIL,MOTRIN) 200 MG tablet Take 600 mg by mouth every 6 (six) hours as needed for pain.   Yes Historical Provider, MD  levofloxacin (LEVAQUIN) 750 MG tablet Take 1 tablet (750 mg total) by mouth daily. X 7 days 08/29/13  Yes John L Molpus, MD  magnesium oxide (MAG-OX) 400 MG tablet Take 400 mg by mouth daily.   Yes Historical Provider, MD  meclizine (ANTIVERT) 25 MG tablet Take 25 mg by mouth daily.   Yes Historical Provider, MD  warfarin (COUMADIN) 10 MG tablet Take 10 mg by mouth See admin instructions. Take 10 mg every Sun, Tues, Thur, Sat   Yes Historical Provider, MD  warfarin (COUMADIN) 7.5 MG tablet Take 7.5 mg by mouth every Monday, Wednesday, and Friday.   Yes Historical Provider, MD   Physical Exam: Filed Vitals:   08/30/13 1730 08/30/13 1745 08/30/13 1800 08/30/13 1830  BP: 128/55 150/78 149/90 119/60  Pulse: 37 51 43 60  Temp:  TempSrc:      Resp: Height:      Weight:      SpO2: 95% 92% 94% 93%    Wt Readings from Last 3 Encounters:  08/30/13 208.655 kg (460 lb)  08/28/13 208.655 kg (460 lb)  07/09/13 208.655 kg (460 lb)    General:  Appears calm and comfortable, morbidly obese Eyes: PERRL, normal lids, irises & conjunctiva ENT: grossly normal hearing, lips & tongue Neck: no LAD, masses or thyromegaly Cardiovascular:s1, s2, irregular, no m/r/g. Lymphedema noted to lower extremities Telemetry: atrial fibrillation Respiratory: mild bilateral expiratory wheezes. Normal respiratory effort. Abdomen: soft, ntnd Skin: LLE has erythema noted on left foot and  medial aspect of left thigh Musculoskeletal: grossly normal tone BUE/BLE Psychiatric: grossly normal mood and affect, speech fluent and appropriate Neurologic: grossly non-focal.          Labs on Admission:  Basic Metabolic Panel:  Recent Labs Lab 08/28/13 2320 08/30/13 1224  NA 132* 137  K 3.9 4.0  CL 96 99  CO2 24 30  GLUCOSE 149* 155*  BUN 15 15  CREATININE 0.97 0.86  CALCIUM 8.7 8.6   Liver Function Tests:  Recent Labs Lab 08/28/13 2320 08/30/13 1224  AST 16 13  ALT 10 9  ALKPHOS 57 58  BILITOT 1.1 0.6  PROT 6.9 6.7  ALBUMIN 3.0* 2.7*   No results found for this basename: LIPASE, AMYLASE,  in the last 168 hours No results found for this basename: AMMONIA,  in the last 168 hours CBC:  Recent Labs Lab 08/28/13 2320 08/30/13 1224  WBC 13.8* 8.1  NEUTROABS 12.0* 6.1  HGB 15.2 14.2  HCT 43.2 41.6  MCV 85.7 86.8  PLT 155 131*   Cardiac Enzymes: No results found for this basename: CKTOTAL, CKMB, CKMBINDEX, TROPONINI,  in the last 168 hours  BNP (last 3 results) No results found for this basename: PROBNP,  in the last 8760 hours CBG: No results found for this basename: GLUCAP,  in the last 168 hours  Radiological Exams on Admission: No results found.  EKG: Independently reviewed. Rapid atrial fibrillation  Assessment/Plan Principal Problem:   Cellulitis Active Problems:   Long term (current) use of anticoagulants   Atrial fibrillation with RVR   Morbid obesity   1. Cellulitis. Patient has significant erythema and edema of his left upper extremity. He also likely has chronic edema in his lower extremities. Review of records indicates that he has multiple drug allergies including vancomycin, clindamycin. He reports that when he previously took vancomycin, he became increasingly erythematous, hyperaerated as, he is unsure whether he developed shortness of breath. For this reason, we will prescribe the patient Teflaro which should have adequate  coverage for cellulitis. 2. Rapid atrial fibrillation. Patient is currently on a diltiazem infusion. He is anticoagulated with Coumadin. We'll restart his oral diltiazem and try to wean off infusion. Will order TSH, cardiac markers and echocardiogram. 3. Obesity. Counseled on the importance of diet and exercise  Code Status: DNR DVT Prophylaxis: therapeutic on coumadin Family Communication: discussed with patient Disposition Plan: discharge home once improved  Time spent:  [facility] Pager [phone]  **Disclaimer: This note may have been dictated with voice recognition software. Similar sounding words can inadvertently be transcribed and this note may contain transcription errors which may not have been corrected upon publication of note.**

## 2013-08-31 DIAGNOSIS — I369 Nonrheumatic tricuspid valve disorder, unspecified: Secondary | ICD-10-CM

## 2013-08-31 LAB — BASIC METABOLIC PANEL
ANION GAP: 8 (ref 5–15)
BUN: 14 mg/dL (ref 6–23)
CALCIUM: 8.2 mg/dL — AB (ref 8.4–10.5)
CO2: 30 meq/L (ref 19–32)
Chloride: 99 mEq/L (ref 96–112)
Creatinine, Ser: 0.72 mg/dL (ref 0.50–1.35)
GFR calc Af Amer: >90 mL/min (ref >90)
GLUCOSE: 125 mg/dL — AB (ref 70–99)
POTASSIUM: 4.3 meq/L (ref 3.7–5.3)
Sodium: 137 mEq/L (ref 137–147)

## 2013-08-31 LAB — TROPONIN I
Troponin I: <0.3 ng/mL (ref <0.30)
Troponin I: <0.3 ng/mL (ref <0.30)

## 2013-08-31 LAB — CBC
HEMATOCRIT: 38.8 % — AB (ref 39.0–52.0)
Hemoglobin: 13.2 g/dL (ref 13.0–17.0)
MCH: 29.7 pg (ref 26.0–34.0)
MCHC: 34 g/dL (ref 30.0–36.0)
MCV: 87.4 fL (ref 78.0–100.0)
PLATELETS: 141 10*3/uL — AB (ref 150–400)
RBC: 4.44 MIL/uL (ref 4.22–5.81)
RDW: 13.9 % (ref 11.5–15.5)
WBC: 6.2 10*3/uL (ref 4.0–10.5)

## 2013-08-31 LAB — PROTIME-INR
INR: 2.57 — ABNORMAL HIGH (ref 0.00–1.49)
Prothrombin Time: 27.6 seconds — ABNORMAL HIGH (ref 11.6–15.2)

## 2013-08-31 LAB — TSH: TSH: 1.97 u[IU]/mL (ref 0.350–4.500)

## 2013-08-31 MED ORDER — METOPROLOL TARTRATE 25 MG PO TABS
25.0000 mg | ORAL_TABLET | Freq: Two times a day (BID) | ORAL | Status: DC
Start: 2013-08-31 — End: 2013-09-04
  Administered 2013-08-31 – 2013-09-04 (×8): 25 mg via ORAL
  Filled 2013-08-31 (×8): qty 1

## 2013-08-31 MED ORDER — WARFARIN SODIUM 10 MG PO TABS
10.0000 mg | ORAL_TABLET | Freq: Once | ORAL | Status: AC
  Administered 2013-08-31: 10 mg via ORAL
  Filled 2013-08-31: qty 1

## 2013-08-31 NOTE — Progress Notes (Signed)
[facility] PROGRESS NOTE  [name] ZOX:[id number] DOB: February 05, 1950 DOA: 08/30/2013 PCP: Colette Ribas, MD  Assessment/Plan: 1. Cellulitis of left leg. Patient started on Teflaro since he has reported allergies to vancomycin and clindamycin. Keep left leg elevated.  Venous doppler has been ordered to rule out DVT. 2. Atrial fibrillation with RVR.  Patient is on cardizem CD  daily. He is still having periods of tachycardia and is still on diltiazem infusion. Will add low dose metoprolol for further AV nodal blocking and hopefully discontinue cardizem infusion. He is anticoagulated with coumadin. 3. Morbid obesity  Code Status: DNR Family Communication: discussed with patient Disposition Plan: discharge home once improved   Consultants:    Procedures:    Antibiotics:  Teflaro 8/29  HPI/Subjective: Feels that leg is feeling a little better. Less tender. No shortness of breath  Objective: Filed Vitals:   08/31/13 0815  BP:   Pulse:   Temp: 97.9 F (36.6 C)  Resp:     Intake/Output Summary (Last 24 hours) at 08/31/13 0857 Last data filed at 08/31/13 0600  Gross per 24 hour  Intake  78.83 ml  Output    350 ml  Net -271.17 ml   Filed Weights   08/30/13 1137 08/31/13 0500  Weight: 208.655 kg (460 lb) 212 kg (467 lb 6 oz)    Exam:   General:  NAD  Cardiovascular: S1, S2 irregular  Respiratory: bilateral exp wheezes, mild, normal respiratory effort  Abdomen: soft, obese, nt, bs+  Musculoskeletal: LLE still erythematous, edematous and tender. RLE edematous   Data Reviewed: Basic Metabolic Panel:  Recent Labs Lab 08/28/13 2320 08/30/13 1224 08/31/13 0619  NA 132* 137 137  K 3.9 4.0 4.3  CL 96 99 99  CO2 GLUCOSE 149* 155* 125*  BUN CREATININE 0.97 0.86 0.72  CALCIUM 8.7 8.6 8.2*   Liver Function Tests:  Recent Labs Lab 08/28/13 2320 08/30/13 1224  AST 16 13  ALT 10 9  ALKPHOS 57 58  BILITOT 1.1  0.6  PROT 6.9 6.7  ALBUMIN 3.0* 2.7*   No results found for this basename: LIPASE, AMYLASE,  in the last 168 hours No results found for this basename: AMMONIA,  in the last 168 hours CBC:  Recent Labs Lab 08/28/13 2320 08/30/13 1224 08/31/13 0619  WBC 13.8* 8.1 6.2  NEUTROABS 12.0* 6.1  --   HGB 15.2 14.2 13.2  HCT 43.2 41.6 38.8*  MCV 85.7 86.8 87.4  PLT 155 131* 141*   Cardiac Enzymes:  Recent Labs Lab 08/30/13 1902 08/31/13 0027 08/31/13 0611  TROPONINI <0.30 <0.30 <0.30   BNP (last 3 results)  Recent Labs  08/30/13 1902  PROBNP 1049.0*   CBG: No results found for this basename: GLUCAP,  in the last 168 hours  Recent Results (from the past 240 hour(s))  CULTURE, BLOOD (ROUTINE X 2)     Status: None   Collection Time    08/30/13 12:30 PM      Result Value Ref Range Status   Specimen Description BLOOD LEFT ARM   Final   Special Requests     Final   Value: BOTTLES DRAWN AEROBIC AND ANAEROBIC 12CC EACH BOTTLE   Culture PENDING   Incomplete   Report Status PENDING   Incomplete  CULTURE, BLOOD (ROUTINE X 2)     Status: None   Collection Time    08/30/13 12:35 PM      Result Value Ref  Range Status   Specimen Description BLOOD RIGHT HAND   Final   Special Requests     Final   Value: BOTTLES DRAWN AEROBIC AND ANAEROBIC 12CC EACH BOTTLE   Culture PENDING   Incomplete   Report Status PENDING   Incomplete  MRSA PCR SCREENING     Status: Abnormal   Collection Time    08/30/13  4:00 PM      Result Value Ref Range Status   MRSA by PCR POSITIVE (*) NEGATIVE Final   Comment:            The GeneXpert MRSA Assay (FDA     approved for NASAL specimens     only), is one component of a     comprehensive MRSA colonization     surveillance program. It is not     intended to diagnose MRSA     infection nor to guide or     monitor treatment for     MRSA infections.     RESULT CALLED TO, READ BACK BY AND VERIFIED WITH:     D.BREWER AT 1933 ON 08/30/13 BY S.VANHOORNE      Studies: Portable Chest 1 View  08/30/2013   CLINICAL DATA:  Cellulitis to the lower left leg.  EXAM: PORTABLE CHEST - 1 VIEW  COMPARISON:  Chest x-ray 03/11/2012.  FINDINGS: Film is underpenetrated secondary to the patient's large body habitus, limiting the diagnostic sensitivity and specificity of this examination. With these limitations in mind, the retrocardiac opacity is favored to be technique related. No definite acute consolidative airspace disease. No pleural effusions. No evidence of pulmonary edema. Mild cardiomegaly is unchanged. Prominence of the central pulmonary arteries is again noted, which could suggest underlying pulmonary arterial hypertension. Upper mediastinal contours are otherwise unremarkable.  IMPRESSION: 1. Low lung volumes without definite radiographic evidence of acute cardiopulmonary disease. 2. Prominence of the central pulmonary arteries may suggest pulmonary arterial hypertension. 3. Mild cardiomegaly.   Electronically Signed   By: Trudie Reed M.D.   On: 08/30/2013 20:06    Scheduled Meds: . ceFTAROline (TEFLARO) IV  600 mg Intravenous Q12H  . Chlorhexidine Gluconate Cloth  6 each Topical Q0600  . diltiazem  360 mg Oral Daily  . magnesium oxide  400 mg Oral Daily  . meclizine  25 mg Oral Daily  . metoprolol tartrate  25 mg Oral BID  . mupirocin ointment  1 application Nasal BID  . sodium chloride  3 mL Intravenous Q12H  . Warfarin - Pharmacist Dosing Inpatient   Does not apply Q24H   Continuous Infusions: . diltiazem (CARDIZEM) infusion 5 mg/hr (08/31/13 0600)    Principal Problem:   Cellulitis Active Problems:   Long term (current) use of anticoagulants   Atrial fibrillation with RVR   Morbid obesity    Time spent:    [facility]  [facility] Pager [phone]. If 7PM-7AM, please contact night-coverage at [email or web address], password [facility] 08/31/2013, 8:57 AM  LOS: 1 day

## 2013-08-31 NOTE — Progress Notes (Signed)
  ANTICOAGULATION CONSULT NOTE  Pharmacy Consult for Warfarin Indication: atrial fibrillation  Allergies  Allergen Reactions  . Strawberry Hives  . Adhesive  [Tape] Rash    Paper tape OK.  . Clindamycin Rash    Raw skin.  . Tetracyclines & Related Rash  . Vancomycin Rash    Patient Measurements: Height:  (180.3 cm) Weight: 467 lb 6 oz (212 kg) IBW/kg (Calculated) : 75.3  Vital Signs: Temp: 97.9 F (36.6 C) (08/30 0815) Temp src: Oral (08/30 0815) BP: 119/74 mmHg (08/30 1030) Pulse Rate: 82 (08/30 1031)  Labs:  Recent Labs  08/28/13 2320 08/30/13 1224 08/30/13 1902 08/31/13 0027 08/31/13 0611 08/31/13 0619  HGB 15.2 14.2  --   --   --  13.2  HCT 43.2 41.6  --   --   --  38.8*  PLT 155 131*  --   --   --  141*  LABPROT 26.2* 25.2*  --   --  27.6*  --   INR 2.40* 2.29*  --   --  2.57*  --   CREATININE 0.97 0.86  --   --   --  0.72  TROPONINI  --   --  <0.30 <0.30 <0.30  --     Estimated Creatinine Clearance: 171.5 ml/min (by C-G formula based on Cr of 0.72).   Medical History: Past Medical History  Diagnosis Date  . Obesity   . Hypertension   . Atrial fibrillation   . Cellulitis   . Hyperlipidemia   . Deep vein thrombosis     hx of    Medications:  Prescriptions prior to admission  Medication Sig Dispense Refill  . diazepam (VALIUM) 5 MG tablet Take 1 tablet (5 mg total) by mouth every 6 (six) hours as needed (dizzines).  20 tablet  0  . diltiazem (CARDIZEM LA) 360 MG 24 hr tablet Take 1 tablet (360 mg total) by mouth daily.  30 tablet  6  . ibuprofen (ADVIL,MOTRIN) 200 MG tablet Take 600 mg by mouth every 6 (six) hours as needed for pain.      Marland Kitchen levofloxacin (LEVAQUIN) 750 MG tablet Take 1 tablet (750 mg total) by mouth daily. X 7 days  7 tablet  0  . magnesium oxide (MAG-OX) 400 MG tablet Take 400 mg by mouth daily.      . meclizine (ANTIVERT) 25 MG tablet Take 25 mg by mouth daily.      Marland Kitchen warfarin (COUMADIN) 10 MG tablet Take 10 mg by mouth  See admin instructions. Take 10 mg every Sun, Tues, Thur, Sat      . warfarin (COUMADIN) 7.5 MG tablet Take 7.5 mg by mouth every Monday, Wednesday, and Friday.       Assessment: Okay for Protocol, warfarin for Afib.  INR therapeutic. Home dose as above.  Recent Levaquin course and new Ceftaroline therapy initiated 8/29.  Goal of Therapy:  INR 2-3   Plan:  Warfarin  PO x 1 (home regimen) Daily PT/INR.  Lamonte Richer R 08/31/2013,11:05 AM

## 2013-08-31 NOTE — Progress Notes (Signed)
Echocardiogram 2D Echocardiogram has been performed.  [name] 08/31/2013, 2:41 PM

## 2013-08-31 NOTE — Plan of Care (Signed)
Problem: Phase I Progression Outcomes Goal: Ventricular heart rate < 120/min Outcome: Progressing Rate decreasing with Cardizem drip Goal: Anticoagulation Therapy per MD order Outcome: Completed/Met Date Met:  08/31/13 Coumadin ordered dosed by Pharmacy Goal: Heart rate or rhythm control medication Outcome: Progressing Cardizem being titrated Goal: Pain controlled with appropriate interventions Outcome: Progressing No complaints of pain, PRN medications ordered if needed Goal: Initial discharge plan identified Outcome: Completed/Met Date Met:  08/31/13 Home

## 2013-09-01 DIAGNOSIS — G4733 Obstructive sleep apnea (adult) (pediatric): Secondary | ICD-10-CM | POA: Diagnosis present

## 2013-09-01 LAB — BASIC METABOLIC PANEL
Anion gap: 6 (ref 5–15)
BUN: 12 mg/dL (ref 6–23)
CO2: 33 mEq/L — ABNORMAL HIGH (ref 19–32)
Calcium: 8.5 mg/dL (ref 8.4–10.5)
Chloride: 101 mEq/L (ref 96–112)
Creatinine, Ser: 0.7 mg/dL (ref 0.50–1.35)
GFR calc Af Amer: >90 mL/min (ref >90)
GFR calc non Af Amer: >90 mL/min (ref >90)
GLUCOSE: 138 mg/dL — AB (ref 70–99)
POTASSIUM: 4.4 meq/L (ref 3.7–5.3)
SODIUM: 140 meq/L (ref 137–147)

## 2013-09-01 LAB — PROTIME-INR
INR: 2.69 — ABNORMAL HIGH (ref 0.00–1.49)
Prothrombin Time: 28.6 seconds — ABNORMAL HIGH (ref 11.6–15.2)

## 2013-09-01 MED ORDER — FUROSEMIDE 10 MG/ML IJ SOLN
40.0000 mg | Freq: Once | INTRAMUSCULAR | Status: AC
  Administered 2013-09-01: 40 mg via INTRAVENOUS
  Filled 2013-09-01: qty 4

## 2013-09-01 MED ORDER — WARFARIN SODIUM 7.5 MG PO TABS
7.5000 mg | ORAL_TABLET | Freq: Once | ORAL | Status: AC
  Administered 2013-09-01: 7.5 mg via ORAL
  Filled 2013-09-01: qty 1

## 2013-09-01 MED ORDER — MAGNESIUM CITRATE PO SOLN
1.0000 | Freq: Once | ORAL | Status: AC
  Administered 2013-09-01: 1 via ORAL
  Filled 2013-09-01: qty 296

## 2013-09-01 NOTE — Evaluation (Signed)
Physical Therapy Evaluation Patient Details Name: BRYCESON GRAPE MRN: [id number] DOB: Oct 04, 1950 Today's Date: 09/01/2013   History of Present Illness  IGNACE MANDIGO is a 63 y.o. male who is morbidly obese and has chronic atrial fibrillation on Coumadin. Patient reports that he initially had nausea and vomiting for approximately 3 days when he came to the emergency room a few days ago. At that time, was noted that he may have some mild erythema developing in his left lower extremity and he was prescribed a course of levofloxacin. Patient took his antibiotics at home, but felt that his cellulitis continued to worsen. He did feel feverish at home. He's not noticed any draining ulcers in his lower extremity. He's not had a recent trauma to the area. He noted that his leg was increasingly edematous, erythematous painful. He presents to the ER for evaluation where it was felt that he may have a worsening cellulitis. He denies any other symptoms such as worsening shortness of breath, chest pain, cough, dysuria, diarrhea or any other complaints. He's being admitted for further treatments  Clinical Impression  Pt is a 63 year old male who presents to PT with dx of Lt LE cellulitis; pt reports decreased swelling since admittance, though pain is still at moderate levels.  Pt reports he was receiving HHPT services prior to hospitalization.  Pt reports surgery to Rt knee (pt reports patellectomy) in 01/2013 with SNF rehab for 1 1/2 months, then surgery to Lt > Rt knee 06/2013 with SNF rehab for 1 month and since then has been receiving HHPT services.  During evaluation, pt was mod (I) with bed mobility skills and transfers though required sitting and standing rest break prior to next movement secondary to fatigue/dizziness.  Pt was able to amb 10 feet with RW and supervision/mod (I) prior to seated rest break.  Pt reports he was able to amb with HHPT ~40 feet prior to rest break at home.  Recommend continued PT  services to address strengthening and activity tolerance for improved functional mobility skills with return to HHPT services at discharge.  No DME recommendations.     Follow Up Recommendations Home health PT    Equipment Recommendations  None recommended by PT       Precautions / Restrictions Precautions Precautions: Fall Restrictions Weight Bearing Restrictions: No      Mobility  Bed Mobility Overal bed mobility: Modified Independent             General bed mobility comments: HOB slightly elevated (pt sleeps in lift chair)  Transfers Overall transfer level: Modified independent               General transfer comment: Use of momentum to transfer to standing, pt reports this is normal for him to use momentum to stand.   Ambulation/Gait Ambulation/Gait assistance: Supervision;Modified independent (Device/Increase time) Ambulation Distance (Feet): 10 Feet Assistive device: Rolling walker (2 wheeled) Gait Pattern/deviations: WFL(Within Functional Limits)   Gait velocity interpretation: Below normal speed for age/gender       Balance Overall balance assessment: No apparent balance deficits (not formally assessed)                                           Pertinent Vitals/Pain Pain Assessment: 0-10 Pain Score: 5  Pain Location: Lt Calf Pain Descriptors / Indicators: Aching;Throbbing Pain Intervention(s): Limited activity within patient's tolerance;Repositioned;Other (comment) (  Elevated at end of evaluation to decrease swelling)    Home Living Family/patient expects to be discharged to:: Private residence Living Arrangements: Alone;Non-relatives/Friends (Pt reports he has been having a friend stay with him in the evening to assist as needed. ) Available Help at Discharge: Friend(s);Available PRN/intermittently Type of Home: Apartment Home Access: Stairs to enter Entrance Stairs-Rails: Left Entrance Stairs-Number of Steps: 2 Home Layout:  One level Home Equipment: Cane - single point;Walker - 2 wheels (Lift chair, handicap height toilet) Additional Comments: Tub Shower    Prior Function Level of Independence: Independent with assistive device(s)   Gait / Transfers Assistance Needed: Pt reports he is mod (I) with bed mobility skills, transfers, and household amb.  Pt reports he has a friend drive him to appointments, and he requires use of W/C for community distances.   ADL's / Homemaking Assistance Needed: Pt reports he sponge baths.            Extremity/Trunk Assessment               Lower Extremity Assessment: Generalized weakness;LLE deficits/detail   LLE Deficits / Details: Redness to Lt calf area, pt reports swelling has been going down since admittance to hospital     Communication   Communication: No difficulties  Cognition Arousal/Alertness: Awake/alert Behavior During Therapy: WFL for tasks assessed/performed Overall Cognitive Status: Within Functional Limits for tasks assessed                        Assessment/Plan    PT Assessment Patient needs continued PT services  PT Diagnosis Difficulty walking;Generalized weakness   PT Problem List Decreased strength;Decreased activity tolerance;Decreased mobility  PT Treatment Interventions Gait training;Functional mobility training;Therapeutic activities;Therapeutic exercise   PT Goals (Current goals can be found in the Care Plan section) Acute Rehab PT Goals Patient Stated Goal: go home PT Goal Formulation: With patient Time For Goal Achievement: 09/15/13 Potential to Achieve Goals: Good    Frequency Min 3X/week    End of Session   Activity Tolerance: Patient limited by fatigue Patient left: in bed;with call bell/phone within reach Nurse Communication: Other (comment) (HR monitor unattached)         Time: 1610-9604 PT Time Calculation (min): 23 min   Charges:   PT Evaluation $Initial PT Evaluation Tier I: 1 Procedure      Kishaun Erekson 09/01/2013, 4:59 PM

## 2013-09-01 NOTE — Progress Notes (Signed)
UR chart review completed.  

## 2013-09-01 NOTE — Progress Notes (Signed)
[facility] PROGRESS NOTE  [name] ZHY:[id number] DOB: 06/03/1950 DOA: 08/30/2013 PCP: Colette Ribas, MD  Assessment/Plan: 1. Cellulitis of left leg. Patient started on Teflaro since he has reported allergies to vancomycin and clindamycin. Keep left leg elevated.  Venous doppler negative for DVT. 2. Atrial fibrillation with RVR.  Patient is on cardizem CD  daily. Lopressor was added to his regimen and heart rate is doing better. He is anticoagulated with coumadin. 3. Morbid obesity 4. Obstructive sleep apnea.  Patient reports he was diagnosed with OSA in the past, but has been unable to tolerate CPAP  Code Status: DNR Family Communication: discussed with patient Disposition Plan: discharge home once improved   Consultants:    Procedures:    Antibiotics:  Teflaro 8/29  HPI/Subjective: Left leg is less tender, less edematous, still erythematous  Objective: Filed Vitals:   09/01/13 0810  BP:   Pulse:   Temp: 98.3 F (36.8 C)  Resp:     Intake/Output Summary (Last 24 hours) at 09/01/13 0909 Last data filed at 09/01/13 0600  Gross per 24 hour  Intake   1459 ml  Output    400 ml  Net   1059 ml   Filed Weights   08/30/13 1137 08/31/13 0500 09/01/13 0500  Weight: 208.655 kg (460 lb) 212 kg (467 lb 6 oz) 210 kg (462 lb 15.5 oz)    Exam:   General:  NAD  Cardiovascular: S1, S2 irregular  Respiratory: bilateral exp wheezes, mild, normal respiratory effort  Abdomen: soft, obese, nt, bs+  Musculoskeletal: LLE still erythematous, less edema and less tender. RLE edematous   Data Reviewed: Basic Metabolic Panel:  Recent Labs Lab 08/28/13 2320 08/30/13 1224 08/31/13 0619 09/01/13 0542  NA 132* 137 137 140  K 3.9 4.0 4.3 4.4  CL 96 99 99 101  CO2 33*  GLUCOSE 149* 155* 125* 138*  BUN CREATININE 0.97 0.86 0.72 0.70  CALCIUM 8.7 8.6 8.2* 8.5   Liver Function Tests:  Recent Labs Lab 08/28/13 2320  08/30/13 1224  AST 16 13  ALT 10 9  ALKPHOS 57 58  BILITOT 1.1 0.6  PROT 6.9 6.7  ALBUMIN 3.0* 2.7*   No results found for this basename: LIPASE, AMYLASE,  in the last 168 hours No results found for this basename: AMMONIA,  in the last 168 hours CBC:  Recent Labs Lab 08/28/13 2320 08/30/13 1224 08/31/13 0619  WBC 13.8* 8.1 6.2  NEUTROABS 12.0* 6.1  --   HGB 15.2 14.2 13.2  HCT 43.2 41.6 38.8*  MCV 85.7 86.8 87.4  PLT 155 131* 141*   Cardiac Enzymes:  Recent Labs Lab 08/30/13 1902 08/31/13 0027 08/31/13 0611  TROPONINI <0.30 <0.30 <0.30   BNP (last 3 results)  Recent Labs  08/30/13 1902  PROBNP 1049.0*   CBG: No results found for this basename: GLUCAP,  in the last 168 hours  Recent Results (from the past 240 hour(s))  CULTURE, BLOOD (ROUTINE X 2)     Status: None   Collection Time    08/30/13 12:30 PM      Result Value Ref Range Status   Specimen Description BLOOD LEFT ARM   Final   Special Requests     Final   Value: BOTTLES DRAWN AEROBIC AND ANAEROBIC 12CC EACH BOTTLE   Culture NO GROWTH 1 DAY   Final   Report Status PENDING   Incomplete  CULTURE, BLOOD (ROUTINE X 2)  Status: None   Collection Time    08/30/13 12:35 PM      Result Value Ref Range Status   Specimen Description BLOOD RIGHT HAND   Final   Special Requests     Final   Value: BOTTLES DRAWN AEROBIC AND ANAEROBIC 12CC EACH BOTTLE   Culture NO GROWTH 1 DAY   Final   Report Status PENDING   Incomplete  MRSA PCR SCREENING     Status: Abnormal   Collection Time    08/30/13  4:00 PM      Result Value Ref Range Status   MRSA by PCR POSITIVE (*) NEGATIVE Final   Comment:            The GeneXpert MRSA Assay (FDA     approved for NASAL specimens     only), is one component of a     comprehensive MRSA colonization     surveillance program. It is not     intended to diagnose MRSA     infection nor to guide or     monitor treatment for     MRSA infections.     RESULT CALLED TO, READ  BACK BY AND VERIFIED WITH:     D.BREWER AT 1933 ON 08/30/13 BY S.VANHOORNE     Studies: US Venous Img Lower Unilateral Left  08/31/2013   CLINICAL DATA:  Left leg pain and swelling  EXAM: Left LOWER EXTREMITY VENOUS DOPPLER ULTRASOUND  TECHNIQUE: Gray-scale sonography with graded compression, as well as color Doppler and duplex ultrasound were performed to evaluate the lower extremity deep venous systems from the level of the common femoral vein and including the common femoral, femoral, profunda femoral, popliteal and calf veins including the posterior tibial, peroneal and gastrocnemius veins when visible. The superficial great saphenous vein was also interrogated. Spectral Doppler was utilized to evaluate flow at rest and with distal augmentation maneuvers in the common femoral, femoral and popliteal veins.  COMPARISON:  None.  FINDINGS: Common Femoral Vein: No evidence of thrombus. Normal compressibility, respiratory phasicity and response to augmentation.  Saphenofemoral Junction: No evidence of thrombus. Normal compressibility and flow on color Doppler imaging.  Profunda Femoral Vein: Not well visualized due to patient body habitus.  Femoral Vein: No evidence of thrombus. Normal compressibility, respiratory phasicity and response to augmentation.  Popliteal Vein: No evidence of thrombus. Normal compressibility, respiratory phasicity and response to augmentation.  Calf Veins: The posterior tibial vein is within normal limits. The anterior tibial and peroneal veins are not well visualized due to the patient's body habitus.  Superficial Great Saphenous Vein: No evidence of thrombus. Normal compressibility and flow on color Doppler imaging.  Venous Reflux:  None.  Other Findings:  None.  IMPRESSION: Somewhat limited exam although no definitive venous thrombosis is noted.   Electronically Signed   By: Alcide Clever M.D.   On: 08/31/2013 10:31   Portable Chest 1 View  08/30/2013   CLINICAL DATA:  Cellulitis to  the lower left leg.  EXAM: PORTABLE CHEST - 1 VIEW  COMPARISON:  Chest x-ray 03/11/2012.  FINDINGS: Film is underpenetrated secondary to the patient's large body habitus, limiting the diagnostic sensitivity and specificity of this examination. With these limitations in mind, the retrocardiac opacity is favored to be technique related. No definite acute consolidative airspace disease. No pleural effusions. No evidence of pulmonary edema. Mild cardiomegaly is unchanged. Prominence of the central pulmonary arteries is again noted, which could suggest underlying pulmonary arterial hypertension. Upper mediastinal contours are  otherwise unremarkable.  IMPRESSION: 1. Low lung volumes without definite radiographic evidence of acute cardiopulmonary disease. 2. Prominence of the central pulmonary arteries may suggest pulmonary arterial hypertension. 3. Mild cardiomegaly.   Electronically Signed   By: Trudie Reed M.D.   On: 08/30/2013 20:06    Scheduled Meds: . ceFTAROline (TEFLARO) IV  600 mg Intravenous Q12H  . Chlorhexidine Gluconate Cloth  6 each Topical Q0600  . diltiazem  360 mg Oral Daily  . magnesium oxide  400 mg Oral Daily  . meclizine  25 mg Oral Daily  . metoprolol tartrate  25 mg Oral BID  . mupirocin ointment  1 application Nasal BID  . sodium chloride  3 mL Intravenous Q12H  . Warfarin - Pharmacist Dosing Inpatient   Does not apply Q24H   Continuous Infusions: . diltiazem (CARDIZEM) infusion Stopped (08/31/13 1212)    Principal Problem:   Cellulitis Active Problems:   Long term (current) use of anticoagulants   Atrial fibrillation with RVR   Morbid obesity    Time spent:    [facility]  [facility] Pager [phone]. If 7PM-7AM, please contact night-coverage at [email or web address], password [facility] 09/01/2013, 9:09 AM  LOS: 2 days

## 2013-09-01 NOTE — Progress Notes (Signed)
ANTICOAGULATION CONSULT NOTE  Pharmacy Consult for Warfarin Indication: atrial fibrillation  Allergies  Allergen Reactions  . Strawberry Hives  . Adhesive  [Tape] Rash    Paper tape OK.  . Clindamycin Rash    Raw skin.  . Tetracyclines & Related Rash  . Vancomycin Rash   Patient Measurements: Height:  (180.3 cm) Weight: 462 lb 15.5 oz (210 kg) IBW/kg (Calculated) : 75.3  Vital Signs: Temp: 98.3 F (36.8 C) (08/31 0810) Temp src: Oral (08/31 0810) BP: 128/80 mmHg (08/31 0800) Pulse Rate: 46 (08/31 0800)  Labs:  Recent Labs  08/30/13 1224 08/30/13 1902 08/31/13 0027 08/31/13 0611 08/31/13 0619 09/01/13 0542  HGB 14.2  --   --   --  13.2  --   HCT 41.6  --   --   --  38.8*  --   PLT 131*  --   --   --  141*  --   LABPROT 25.2*  --   --  27.6*  --  28.6*  INR 2.29*  --   --  2.57*  --  2.69*  CREATININE 0.86  --   --   --  0.72 0.70  TROPONINI  --  <0.30 <0.30 <0.30  --   --    Estimated Creatinine Clearance: 170.5 ml/min (by C-G formula based on Cr of 0.7).  Medical History: Past Medical History  Diagnosis Date  . Obesity   . Hypertension   . Atrial fibrillation   . Cellulitis   . Hyperlipidemia   . Deep vein thrombosis     hx of   Medications:  Prescriptions prior to admission  Medication Sig Dispense Refill  . diazepam (VALIUM) 5 MG tablet Take 1 tablet (5 mg total) by mouth every 6 (six) hours as needed (dizzines).  20 tablet  0  . diltiazem (CARDIZEM LA) 360 MG 24 hr tablet Take 1 tablet (360 mg total) by mouth daily.  30 tablet  6  . ibuprofen (ADVIL,MOTRIN) 200 MG tablet Take 600 mg by mouth every 6 (six) hours as needed for pain.      Marland Kitchen levofloxacin (LEVAQUIN) 750 MG tablet Take 1 tablet (750 mg total) by mouth daily. X 7 days  7 tablet  0  . magnesium oxide (MAG-OX) 400 MG tablet Take 400 mg by mouth daily.      . meclizine (ANTIVERT) 25 MG tablet Take 25 mg by mouth daily.      Marland Kitchen warfarin (COUMADIN) 10 MG tablet Take 10 mg by mouth See  admin instructions. Take 10 mg every Sun, Tues, Thur, Sat      . warfarin (COUMADIN) 7.5 MG tablet Take 7.5 mg by mouth every Monday, Wednesday, and Friday.       Assessment: Okay for Protocol, warfarin for Afib.  INR therapeutic. Home dose as above.  Recent Levaquin course and new Ceftaroline therapy initiated 8/29.  Goal of Therapy:  INR 2-3   Plan:  Warfarin 7.5mg  PO x 1 (home regimen) Daily PT/INR.  Margo Aye, Lorelee Mclaurin A 09/01/2013,10:07 AM

## 2013-09-01 NOTE — Progress Notes (Signed)
Discussed the Advance Directives information and process for completing the forms. Patient asked to review with his family and would like to complete tomorrow. Will follow up then.

## 2013-09-02 LAB — CBC
HCT: 41.9 % (ref 39.0–52.0)
HEMOGLOBIN: 14.3 g/dL (ref 13.0–17.0)
MCH: 29.9 pg (ref 26.0–34.0)
MCHC: 34.1 g/dL (ref 30.0–36.0)
MCV: 87.7 fL (ref 78.0–100.0)
Platelets: 211 10*3/uL (ref 150–400)
RBC: 4.78 MIL/uL (ref 4.22–5.81)
RDW: 13.6 % (ref 11.5–15.5)
WBC: 7.8 10*3/uL (ref 4.0–10.5)

## 2013-09-02 LAB — BASIC METABOLIC PANEL
ANION GAP: 7 (ref 5–15)
BUN: 12 mg/dL (ref 6–23)
CHLORIDE: 99 meq/L (ref 96–112)
CO2: 33 meq/L — AB (ref 19–32)
Calcium: 8.4 mg/dL (ref 8.4–10.5)
Creatinine, Ser: 0.74 mg/dL (ref 0.50–1.35)
GFR calc Af Amer: >90 mL/min (ref >90)
GFR calc non Af Amer: >90 mL/min (ref >90)
Glucose, Bld: 127 mg/dL — ABNORMAL HIGH (ref 70–99)
Potassium: 4.2 mEq/L (ref 3.7–5.3)
SODIUM: 139 meq/L (ref 137–147)

## 2013-09-02 LAB — PROTIME-INR
INR: 2.86 — AB (ref 0.00–1.49)
Prothrombin Time: 30 seconds — ABNORMAL HIGH (ref 11.6–15.2)

## 2013-09-02 MED ORDER — WARFARIN SODIUM 5 MG PO TABS
10.0000 mg | ORAL_TABLET | Freq: Once | ORAL | Status: AC
  Administered 2013-09-02: 10 mg via ORAL
  Filled 2013-09-02: qty 2

## 2013-09-02 NOTE — Progress Notes (Signed)
Pt ambulated in room with tech to bathroom and back to bed.  Pt complaining of pain in left foot after ambulation.  Foot elevated with pillow for relief.  Will continue to monitor.

## 2013-09-02 NOTE — Progress Notes (Addendum)
[facility] PROGRESS NOTE  [name] ZOX:[id number] DOB: 1950-01-20 DOA: 08/30/2013 PCP: Colette Ribas, MD  Summary:  This is a 63 y/o morbidly obese man who presented to the ED with complaints of left leg erythema, pain and swelling.  He was found to have a cellulitis of the left leg and started on Teflaro due to allergies to vancomycin and clindamycin. His cellulitis has been slow to improve, but is improving.  He can likely be transitioned to po antibiotics tomorrow.  He was also noted to be in rapid atrial fibrillation on admission.  He initially required a cardizem infusion, but has since been transitioned to po cardizem and po lopressor.  He is anticoagulated with coumadin.  Anticipate discharge home in the next 24 hours.  Assessment/Plan: 1. Cellulitis of left leg. Patient started on Teflaro since he has reported allergies to vancomycin and clindamycin. Keep left leg elevated.  Venous doppler negative for DVT. Progress has been slow, but I feel he would benefit from another day of intravenous antibiotics.  Can consider switching to oral antibiotics tomorrow 2. Atrial fibrillation with RVR.  Patient is on cardizem CD  daily. Lopressor was added to his regimen and heart rate has improved. He is anticoagulated with coumadin. 3. Morbid obesity 4. Obstructive sleep apnea.  Patient reports he was diagnosed with OSA in the past, but has been unable to tolerate CPAP  Code Status: DNR Family Communication: discussed with patient Disposition Plan: discharge home once improved   Consultants:    Procedures:    Antibiotics:  Teflaro 8/29>>  HPI/Subjective: Left leg feels tighter today. Feels that erythema is slowly improving  Objective: Filed Vitals:   09/02/13 1439  BP: 118/60  Pulse: 90  Temp: 98 F (36.7 C)  Resp: 20    Intake/Output Summary (Last 24 hours) at 09/02/13 1712 Last data filed at 09/02/13 1300  Gross per 24 hour  Intake    980 ml   Output    850 ml  Net    130 ml   Filed Weights   08/31/13 0500 09/01/13 0500 09/01/13 2237  Weight: 212 kg (467 lb 6 oz) 210 kg (462 lb 15.5 oz) 208.655 kg (460 lb)    Exam:   General:  NAD  Cardiovascular: S1, S2 irregular  Respiratory: bilateral exp wheezes, mild, normal respiratory effort  Abdomen: soft, obese, nt, bs+  Musculoskeletal: LLE erythema appears to be improving, less tender, less edematous. RLE edematous   Data Reviewed: Basic Metabolic Panel:  Recent Labs Lab 08/28/13 2320 08/30/13 1224 08/31/13 0619 09/01/13 0542 09/02/13 0615  NA 132* 137 137 140 139  K 3.9 4.0 4.3 4.4 4.2  CL 96 99 99 101 99  CO2 33* 33*  GLUCOSE 149* 155* 125* 138* 127*  BUN CREATININE 0.97 0.86 0.72 0.70 0.74  CALCIUM 8.7 8.6 8.2* 8.5 8.4   Liver Function Tests:  Recent Labs Lab 08/28/13 2320 08/30/13 1224  AST 16 13  ALT 10 9  ALKPHOS 57 58  BILITOT 1.1 0.6  PROT 6.9 6.7  ALBUMIN 3.0* 2.7*   No results found for this basename: LIPASE, AMYLASE,  in the last 168 hours No results found for this basename: AMMONIA,  in the last 168 hours CBC:  Recent Labs Lab 08/28/13 2320 08/30/13 1224 08/31/13 0619 09/02/13 0615  WBC 13.8* 8.1 6.2 7.8  NEUTROABS 12.0* 6.1  --   --   HGB 15.2 14.2 13.2 14.3  HCT 43.2 41.6 38.8* 41.9  MCV 85.7 86.8 87.4 87.7  PLT 155 131* 141* 211   Cardiac Enzymes:  Recent Labs Lab 08/30/13 1902 08/31/13 0027 08/31/13 0611  TROPONINI <0.30 <0.30 <0.30   BNP (last 3 results)  Recent Labs  08/30/13 1902  PROBNP 1049.0*   CBG: No results found for this basename: GLUCAP,  in the last 168 hours  Recent Results (from the past 240 hour(s))  CULTURE, BLOOD (ROUTINE X 2)     Status: None   Collection Time    08/30/13 12:30 PM      Result Value Ref Range Status   Specimen Description BLOOD LEFT ARM   Final   Special Requests     Final   Value: BOTTLES DRAWN AEROBIC AND ANAEROBIC 12CC EACH BOTTLE    Culture NO GROWTH 3 DAYS   Final   Report Status PENDING   Incomplete  CULTURE, BLOOD (ROUTINE X 2)     Status: None   Collection Time    08/30/13 12:35 PM      Result Value Ref Range Status   Specimen Description BLOOD RIGHT HAND   Final   Special Requests     Final   Value: BOTTLES DRAWN AEROBIC AND ANAEROBIC 12CC EACH BOTTLE   Culture NO GROWTH 3 DAYS   Final   Report Status PENDING   Incomplete  MRSA PCR SCREENING     Status: Abnormal   Collection Time    08/30/13  4:00 PM      Result Value Ref Range Status   MRSA by PCR POSITIVE (*) NEGATIVE Final   Comment:            The GeneXpert MRSA Assay (FDA     approved for NASAL specimens     only), is one component of a     comprehensive MRSA colonization     surveillance program. It is not     intended to diagnose MRSA     infection nor to guide or     monitor treatment for     MRSA infections.     RESULT CALLED TO, READ BACK BY AND VERIFIED WITH:     D.BREWER AT 1933 ON 08/30/13 BY S.VANHOORNE     Studies: No results found.  Scheduled Meds: . ceFTAROline (TEFLARO) IV  600 mg Intravenous Q12H  . Chlorhexidine Gluconate Cloth  6 each Topical Q0600  . diltiazem  360 mg Oral Daily  . magnesium oxide  400 mg Oral Daily  . meclizine  25 mg Oral Daily  . metoprolol tartrate  25 mg Oral BID  . mupirocin ointment  1 application Nasal BID  . sodium chloride  3 mL Intravenous Q12H  . warfarin  10 mg Oral Once  . Warfarin - Pharmacist Dosing Inpatient   Does not apply Q24H   Continuous Infusions: . diltiazem (CARDIZEM) infusion Stopped (08/31/13 1212)    Principal Problem:   Cellulitis Active Problems:   Long term (current) use of anticoagulants   Atrial fibrillation with RVR   Morbid obesity   Obstructive sleep apnea    Time spent:    [facility]  [facility] Pager [phone]. If 7PM-7AM, please contact night-coverage at [email or web address], password [facility] 09/02/2013, 5:12 PM  LOS: 3 days

## 2013-09-02 NOTE — Care Management Note (Addendum)
    Page 1 of 1   09/04/2013     2:58:24 PM CARE MANAGEMENT NOTE 09/04/2013  Patient:  [name], [name]   Account Number:  [id number]  Date Initiated:  09/02/2013  Documentation initiated by:  Kathyrn Sheriff  Subjective/Objective Assessment:   Pt is from home with [facility] PT through [facility]. Pt has a walker at home for PRN use. Pt has no medication needs prior to admission.     Action/Plan:   Pt plans to discharge home with resumption of HH services. [facility] will be notified of resumption of services when pt is discharge. Will continue to follow for [facility] needs. Beth with [facility] made aware of pt's possible discharge home today.   Anticipated DC Date:  09/02/2013   Anticipated DC Plan:  HOME W HOME HEALTH SERVICES         Choice offered to / List presented to:          [facility] arranged  HH-2 PT      [facility] agency  [facility]   Status of service:  Completed, signed off Medicare Important Message given?  YES (If response is "NO", the following Medicare IM given date fields will be blank) Date Medicare IM given:  09/02/2013 Medicare IM given by:  Kathyrn Sheriff Date Additional Medicare IM given:   Additional Medicare IM given by:    Discharge Disposition:  HOME W HOME HEALTH SERVICES  Per UR Regulation:    If discussed at Long Length of Stay Meetings, dates discussed:    Comments:  09/04/2013 1400 Kathyrn Sheriff, RN, MSN, PCCN Pt discharged home with resumption of [facility] RN/ PT. Beth with [facility] notified of pt's discharge and Discharge summary faxed. No further CM needs at this time.  09/03/2013 1400 Kathyrn Sheriff, RN, MSN, PCCN Beth of [facility] notified of pt's admission date and pending discharge tomorrow. Pt has no further CM needs at this time.  09/02/2013 1400 Kathyrn Sheriff, RN, MSN, PCCN Will fax [facility] resumption orders to [facility] after pt is discharged.

## 2013-09-02 NOTE — Progress Notes (Signed)
Pt wanted to sleep but stated he would ambulate in hallway this afternoon.  Will continue to encourage ambulation.

## 2013-09-02 NOTE — Progress Notes (Signed)
ANTICOAGULATION CONSULT NOTE  Pharmacy Consult for Warfarin Indication: atrial fibrillation  Allergies  Allergen Reactions  . Strawberry Hives  . Adhesive  [Tape] Rash    Paper tape OK.  . Clindamycin Rash    Raw skin.  . Tetracyclines & Related Rash  . Vancomycin Rash   Patient Measurements: Height:  (180.3 cm) Weight: 460 lb (208.655 kg) IBW/kg (Calculated) : 75.3  Vital Signs: Temp: 97.8 F (36.6 C) (09/01 0650) Temp src: Oral (09/01 0650) BP: 116/53 mmHg (09/01 0650) Pulse Rate: 90 (09/01 0650)  Labs:  Recent Labs  08/30/13 1224 08/30/13 1902 08/31/13 0027 08/31/13 1610 08/31/13 0619 09/01/13 0542 09/02/13 0615  HGB 14.2  --   --   --  13.2  --  14.3  HCT 41.6  --   --   --  38.8*  --  41.9  PLT 131*  --   --   --  141*  --  211  LABPROT 25.2*  --   --  27.6*  --  28.6* 30.0*  INR 2.29*  --   --  2.57*  --  2.69* 2.86*  CREATININE 0.86  --   --   --  0.72 0.70 0.74  TROPONINI  --  <0.30 <0.30 <0.30  --   --   --    Estimated Creatinine Clearance: 169.8 ml/min (by C-G formula based on Cr of 0.74).  Medical History: Past Medical History  Diagnosis Date  . Obesity   . Hypertension   . Atrial fibrillation   . Cellulitis   . Hyperlipidemia   . Deep vein thrombosis     hx of   Medications:  Prescriptions prior to admission  Medication Sig Dispense Refill  . diazepam (VALIUM) 5 MG tablet Take 1 tablet (5 mg total) by mouth every 6 (six) hours as needed (dizzines).  20 tablet  0  . diltiazem (CARDIZEM LA) 360 MG 24 hr tablet Take 1 tablet (360 mg total) by mouth daily.  30 tablet  6  . ibuprofen (ADVIL,MOTRIN) 200 MG tablet Take 600 mg by mouth every 6 (six) hours as needed for pain.      Marland Kitchen levofloxacin (LEVAQUIN) 750 MG tablet Take 1 tablet (750 mg total) by mouth daily. X 7 days  7 tablet  0  . magnesium oxide (MAG-OX) 400 MG tablet Take 400 mg by mouth daily.      . meclizine (ANTIVERT) 25 MG tablet Take 25 mg by mouth daily.      Marland Kitchen warfarin  (COUMADIN) 10 MG tablet Take 10 mg by mouth See admin instructions. Take 10 mg every Sun, Tues, Thur, Sat      . warfarin (COUMADIN) 7.5 MG tablet Take 7.5 mg by mouth every Monday, Wednesday, and Friday.       Assessment: Okay for Protocol, warfarin for Afib.  INR therapeutic. Home dose as above.  Recent Levaquin course and new Ceftaroline therapy initiated 8/29.  Goal of Therapy:  INR 2-3   Plan:  Warfarin  PO x 1 (home regimen) Daily PT/INR.  Margo Aye, Marlos Carmen A 09/02/2013,7:44 AM

## 2013-09-03 LAB — PROTIME-INR
INR: 2.98 — AB (ref 0.00–1.49)
PROTHROMBIN TIME: 31 s — AB (ref 11.6–15.2)

## 2013-09-03 LAB — MAGNESIUM: Magnesium: 2.3 mg/dL (ref 1.5–2.5)

## 2013-09-03 MED ORDER — WARFARIN SODIUM 7.5 MG PO TABS
7.5000 mg | ORAL_TABLET | Freq: Once | ORAL | Status: AC
  Administered 2013-09-03: 7.5 mg via ORAL
  Filled 2013-09-03: qty 1

## 2013-09-03 NOTE — Progress Notes (Signed)
ANTICOAGULATION CONSULT NOTE  Pharmacy Consult for Warfarin Indication: atrial fibrillation  Allergies  Allergen Reactions  . Strawberry Hives  . Adhesive  [Tape] Rash    Paper tape OK.  . Clindamycin Rash    Raw skin.  . Tetracyclines & Related Rash  . Vancomycin Rash   Patient Measurements: Height:  (180.3 cm) Weight: 460 lb (208.655 kg) IBW/kg (Calculated) : 75.3  Vital Signs: Temp: 98.4 F (36.9 C) (09/02 0648) Temp src: Oral (09/02 0648) BP: 125/60 mmHg (09/02 0648) Pulse Rate: 81 (09/02 0648)  Labs:  Recent Labs  09/01/13 0542 09/02/13 0615 09/03/13 0622  HGB  --  14.3  --   HCT  --  41.9  --   PLT  --  211  --   LABPROT 28.6* 30.0* 31.0*  INR 2.69* 2.86* 2.98*  CREATININE 0.70 0.74  --    Estimated Creatinine Clearance: 169.8 ml/min (by C-G formula based on Cr of 0.74).  Medical History: Past Medical History  Diagnosis Date  . Obesity   . Hypertension   . Atrial fibrillation   . Cellulitis   . Hyperlipidemia   . Deep vein thrombosis     hx of   Medications:  Prescriptions prior to admission  Medication Sig Dispense Refill  . diazepam (VALIUM) 5 MG tablet Take 1 tablet (5 mg total) by mouth every 6 (six) hours as needed (dizzines).  20 tablet  0  . diltiazem (CARDIZEM LA) 360 MG 24 hr tablet Take 1 tablet (360 mg total) by mouth daily.  30 tablet  6  . ibuprofen (ADVIL,MOTRIN) 200 MG tablet Take 600 mg by mouth every 6 (six) hours as needed for pain.      Marland Kitchen levofloxacin (LEVAQUIN) 750 MG tablet Take 1 tablet (750 mg total) by mouth daily. X 7 days  7 tablet  0  . magnesium oxide (MAG-OX) 400 MG tablet Take 400 mg by mouth daily.      . meclizine (ANTIVERT) 25 MG tablet Take 25 mg by mouth daily.      Marland Kitchen warfarin (COUMADIN) 10 MG tablet Take 10 mg by mouth See admin instructions. Take 10 mg every Sun, Tues, Thur, Sat      . warfarin (COUMADIN) 7.5 MG tablet Take 7.5 mg by mouth every Monday, Wednesday, and Friday.       Assessment: Okay for  Protocol, warfarin for Afib.  INR therapeutic at upper end of range. Home dose as above.  Recent Levaquin course and new Ceftaroline therapy initiated 8/29.  Goal of Therapy:  INR 2-3   Plan:  Warfarin 7.5mg  PO x 1 today (home regimen) Daily PT/INR.  Valrie Hart A 09/03/2013,2:23 PM

## 2013-09-03 NOTE — Progress Notes (Signed)
PROGRESS NOTE  LIOR HOEN ZOX:[id number] DOB: [birth year] DOA: 08/30/2013 PCP: Colette Ribas, MD  Summary: 63 year old man presented with several day history of erythema left lower extremity despite Levaquin. He is admitted for further treatment of left lower extremity cellulitis and atrial fibrillation with rapid ventricular response.  Assessment/Plan: 1. LLE cellulitis. Rapidly improving. Allergies to vancomycin and clindamycin. [place] negative for DVT. Levaquin prior to admit. 2. Atrial fibrillation with RVR. Rate control. Maintained on Cardizem 360. Lopressor added this admission. Continue warfarin. Brief NSVT x2 last night, electrolytes stable. 3. OSA, unable to tolerate CPAP 4. Morbid obesity 5. Intertrigo    Appears to be improving rapidly. Plan complete IV antibiotics today, anticipate discharge 9/3 on oral antibiotics.  Remove telemetry  Code Status: DNR DVT prophylaxis: warfarin Family Communication: none present Disposition Plan: home  [name], MD  [facility]  Pager [phone] If 7PM-7AM, please contact night-coverage at [email or web address], password [facility] 09/03/2013, 12:27 PM  LOS: 4 days   Consultants:  PT: HH PT  Procedures:  8/30 2-D echocardiogram with poor image quality, technically difficult. Systolic function could not be assessed, wall motion could not be assessed, diastolic dysfunction cannot be assessed.  Antibiotics:  Teflaro 8/29>> 9/2 Bactrim 9/3 > 9/4 Keflex 9/3 >> 9/4  HPI/Subjective: Reports some vertigo which is a chronic intermittent issue. Left foot pain is improved. Erythema left foot has resolved he thinks although he still has some erythema in his thigh.  Objective: Filed Vitals:   09/02/13 2204 09/02/13 2325 09/03/13 0219 09/03/13 0648  BP: 121/75 115/58 143/75 125/60  Pulse: 54 84 112 81  Temp: 97.9 F (36.6 C)  98 F (36.7 C) 98.4 F (36.9 C)  TempSrc: Oral  Oral Oral  Resp: Height:      Weight:       SpO2: 94% 96% 92% 94%    Intake/Output Summary (Last 24 hours) at 09/03/13 1227 Last data filed at 09/03/13 0944  Gross per 24 hour  Intake    483 ml  Output   3000 ml  Net  -2517 ml     Filed Weights   08/31/13 0500 09/01/13 0500 09/01/13 2237  Weight: 212 kg (467 lb 6 oz) 210 kg (462 lb 15.5 oz) 208.655 kg (460 lb)    Exam:     Afebrile, vital signs stable. Nontoxic. Gen. Appears calm and comfortable.   Psych. Alert. Speech fluent and clear.  Cardiovascular. Irregular rhythm, normal rate, no murmur, rub or gallop. Left lower extremity edema appears resolved. Chronic right lower extremity edema by report. Telemetry atrial fibrillation. 4 beat NSVT  Respiratory. Clear to auscultation bilaterally. No wheezes, rales or rhonchi. Normal respiratory effort.  Skin. There is some left lower extremity erythema over her in the midportion of the lower leg. There is no fluctuance or pain with palpation. Erythema of the foot appears to have resolved. Dorsalis pedis pulse 2+. Sensation grossly intact. The thigh appears stable without significant erythema.  Musculoskeletal. Able to lift left leg off the bed.  Data Reviewed:  Chemistry: Magnesium unremarkable. Basic metabolic panel 9/1 was unremarkable.  Heme: INR stable, 2.98.  ID: Blood cultures no growth to date  Scheduled Meds: . ceFTAROline (TEFLARO) IV  600 mg Intravenous Q12H  . Chlorhexidine Gluconate Cloth  6 each Topical Q0600  . diltiazem  360 mg Oral Daily  . magnesium oxide  400 mg Oral Daily  . meclizine  25 mg Oral Daily  . metoprolol tartrate  25  mg Oral BID  . mupirocin ointment  1 application Nasal BID  . sodium chloride  3 mL Intravenous Q12H  . Warfarin - Pharmacist Dosing Inpatient   Does not apply Q24H   Continuous Infusions: . diltiazem (CARDIZEM) infusion Stopped (08/31/13 1212)    Principal Problem:   Cellulitis Active Problems:   Long term (current) use of anticoagulants   Atrial fibrillation  with RVR   Morbid obesity   Obstructive sleep apnea   Time spent 20 minutes

## 2013-09-04 DIAGNOSIS — Z7901 Long term (current) use of anticoagulants: Secondary | ICD-10-CM

## 2013-09-04 LAB — CULTURE, BLOOD (ROUTINE X 2)
CULTURE: NO GROWTH
Culture: NO GROWTH

## 2013-09-04 LAB — PROTIME-INR
INR: 3.32 — AB (ref 0.00–1.49)
PROTHROMBIN TIME: 33.7 s — AB (ref 11.6–15.2)

## 2013-09-04 MED ORDER — SULFAMETHOXAZOLE-TMP DS 800-160 MG PO TABS: 1.0000 | ORAL_TABLET | Freq: Two times a day (BID) | ORAL | Status: DC

## 2013-09-04 MED ORDER — CEPHALEXIN 500 MG PO CAPS: 500.0000 mg | ORAL_CAPSULE | Freq: Four times a day (QID) | ORAL | Status: DC

## 2013-09-04 MED ORDER — CEPHALEXIN 500 MG PO CAPS
500.0000 mg | ORAL_CAPSULE | Freq: Four times a day (QID) | ORAL | Status: DC
Start: 2013-09-04 — End: 2013-09-04

## 2013-09-04 MED ORDER — HYDROCODONE-ACETAMINOPHEN 10-325 MG PO TABS: 1.0000 | ORAL_TABLET | Freq: Four times a day (QID) | ORAL | Status: AC | PRN

## 2013-09-04 NOTE — Progress Notes (Signed)
UR review complete.  

## 2013-09-04 NOTE — Discharge Summary (Signed)
Physician Discharge Summary  [name] XBJ:[id number] DOB: [birth year] DOA: 08/30/2013  PCP: Randall Ribas, MD  Admit date: 08/30/2013 Discharge date: 09/04/2013  Recommendations for Outpatient Follow-up:  1. Resolution of left lower extremity cellulitis 2. PT/INR checked on warfarin for atrial fibrillation in context of antibiotics   Follow-up Information   Follow up with Randall Ribas, MD. Schedule an appointment as soon as possible for a visit in 1 week.   Specialty:  Family Medicine   Contact information:   [address] [place] [address] [phone]       Follow up with [facility] [place] On 09/10/2013. (1:15 PM)    Specialty:  Cardiology   Contact information:   [address] [place] [address] [phone]     Discharge Diagnoses:  1. Left lower extremity cellulitis (principal diagnosis) 2. Atrial fibrillation with a rapid ventricular response 3. Morbid obesity  Discharge Condition: Improved Disposition: Home  Diet recommendation: Regular  Filed Weights   08/31/13 0500 09/01/13 0500 09/01/13 2237  Weight: 212 kg (467 lb 6 oz) 210 kg (462 lb 15.5 oz) 208.655 kg (460 lb)    History of present illness:  63 year old man with several day history of left lower extremity erythema and despite Levaquin as an outpatient. Admitted for left lower extremity cellulitis and atrial fibrillation with rapid ventricular response.  Hospital Course:  [name] was treated with empiric antibiotics with gradual improvement in his cellulitis which has nearly resolved at this point. Plan transition to oral antibiotics with outpatient followup. Atrial fibrillation well controlled during hospitalization with temporary addition of metoprolol. Not infection under control, plan to resume his usual outpatient dosing. Warfarin will be held for the next several days to discourage elevation of INR while on antibiotics. He is allergic to clindamycin and  tetracyclines, therefore treat with Bactrim and Keflex.  1. LLE cellulitis. Continues to improve. Allergies to vancomycin, TCN and clindamycin. [place] negative for DVT. Levaquin prior to admit. 2. Atrial fibrillation with RVR. Rate remains controlled. Maintained on Cardizem 360. Lopressor added this admission for RVR, rate very controlled, exacerbated by infection, will stop Lopressor now. Continue warfarin.  3. OSA, unable to tolerate CPAP 4. Morbid obesity Plan discharge home today, change to oral antibiotics to cover strep and staph.  Hold warfarin next 3 days (no hx of clot or stroke); plan outpatient followup with Coumadin clinic next week (closed Monday), risk for significant elevation on antibiotics fairly low with only 3 days of therapy left.  Counseled to monitor for worsening of infection or onset of bleeding.  Follow-up with Coumadin Clinic 9/9 @ 1:15 PM   Discharge Instructions  Discharge Instructions   Activity as tolerated - No restrictions    Complete by:  As directed      Diet - low sodium heart healthy    Complete by:  As directed      Discharge instructions    Complete by:  As directed   Call physician or seek immediate medical attention for recurrent pain left lower leg, swelling, redness or worsening in condition. Do not take Coumadin September 3, September 4, September 5. Resume usual dosing September 6.          Current Discharge Medication List    START taking these medications   Details  cephALEXin (KEFLEX) 500 MG capsule Take 1 capsule (500 mg total) by mouth 4 (four) times daily. Qty: 12 capsule, Refills: 0    HYDROcodone-acetaminophen (NORCO) 10-325 MG per tablet Take 1  tablet by mouth every 6 (six) hours as needed for moderate pain. Qty: 10 tablet, Refills: 0    sulfamethoxazole-trimethoprim (BACTRIM DS) 800-160 MG per tablet Take 1 tablet by mouth every 12 (twelve) hours. Qty: 6 tablet, Refills: 0      CONTINUE these medications which have NOT CHANGED     Details  diazepam (VALIUM) 5 MG tablet Take 1 tablet (5 mg total) by mouth every 6 (six) hours as needed (dizzines). Qty: 20 tablet, Refills: 0    diltiazem (CARDIZEM LA) 360 MG 24 hr tablet Take 1 tablet (360 mg total) by mouth daily. Qty: 30 tablet, Refills: 6    magnesium oxide (MAG-OX) 400 MG tablet Take 400 mg by mouth daily.    meclizine (ANTIVERT) 25 MG tablet Take 25 mg by mouth daily.    !! warfarin (COUMADIN) 10 MG tablet Take 10 mg by mouth every Monday, Wednesday, and Friday.     !! warfarin (COUMADIN) 7.5 MG tablet Take 7.5 mg by mouth 4 (four) times a week. Take 7.5mg  on Tuesday, Thursday, Saturday, & Sunday.  Take  dose on other days of week.     !! - Potential duplicate medications found. Please discuss with provider.    STOP taking these medications     ibuprofen (ADVIL,MOTRIN) 200 MG tablet      levofloxacin (LEVAQUIN) 750 MG tablet        Allergies  Allergen Reactions  . Strawberry Hives  . Adhesive  [Tape] Rash    Paper tape OK.  . Clindamycin Rash    Raw skin.  . Tetracyclines & Related Rash  . Vancomycin Rash    The results of significant diagnostics from this hospitalization (including imaging, microbiology, ancillary and laboratory) are listed below for reference.    Significant Diagnostic Studies: US Venous Img Lower Unilateral Left  08/31/2013   CLINICAL DATA:  Left leg pain and swelling  EXAM: Left LOWER EXTREMITY VENOUS DOPPLER ULTRASOUND  TECHNIQUE: Gray-scale sonography with graded compression, as well as color Doppler and duplex ultrasound were performed to evaluate the lower extremity deep venous systems from the level of the common femoral vein and including the common femoral, femoral, profunda femoral, popliteal and calf veins including the posterior tibial, peroneal and gastrocnemius veins when visible. The superficial great saphenous vein was also interrogated. Spectral Doppler was utilized to evaluate flow at rest and with distal  augmentation maneuvers in the common femoral, femoral and popliteal veins.  COMPARISON:  None.  FINDINGS: Common Femoral Vein: No evidence of thrombus. Normal compressibility, respiratory phasicity and response to augmentation.  Saphenofemoral Junction: No evidence of thrombus. Normal compressibility and flow on color Doppler imaging.  Profunda Femoral Vein: Not well visualized due to patient body habitus.  Femoral Vein: No evidence of thrombus. Normal compressibility, respiratory phasicity and response to augmentation.  Popliteal Vein: No evidence of thrombus. Normal compressibility, respiratory phasicity and response to augmentation.  Calf Veins: The posterior tibial vein is within normal limits. The anterior tibial and peroneal veins are not well visualized due to the patient's body habitus.  Superficial Great Saphenous Vein: No evidence of thrombus. Normal compressibility and flow on color Doppler imaging.  Venous Reflux:  None.  Other Findings:  None.  IMPRESSION: Somewhat limited exam although no definitive venous thrombosis is noted.   Electronically Signed   By: Alcide Clever M.D.   On: 08/31/2013 10:31   Portable Chest 1 View  08/30/2013   CLINICAL DATA:  Cellulitis to the lower left leg.  EXAM: PORTABLE CHEST - 1 VIEW  COMPARISON:  Chest x-ray 03/11/2012.  FINDINGS: Film is underpenetrated secondary to the patient's large body habitus, limiting the diagnostic sensitivity and specificity of this examination. With these limitations in mind, the retrocardiac opacity is favored to be technique related. No definite acute consolidative airspace disease. No pleural effusions. No evidence of pulmonary edema. Mild cardiomegaly is unchanged. Prominence of the central pulmonary arteries is again noted, which could suggest underlying pulmonary arterial hypertension. Upper mediastinal contours are otherwise unremarkable.  IMPRESSION: 1. Low lung volumes without definite radiographic evidence of acute cardiopulmonary  disease. 2. Prominence of the central pulmonary arteries may suggest pulmonary arterial hypertension. 3. Mild cardiomegaly.   Electronically Signed   By: Trudie Reed M.D.   On: 08/30/2013 20:06    Microbiology: Recent Results (from the past 240 hour(s))  CULTURE, BLOOD (ROUTINE X 2)     Status: None   Collection Time    08/30/13 12:30 PM      Result Value Ref Range Status   Specimen Description BLOOD LEFT ARM   Final   Special Requests     Final   Value: BOTTLES DRAWN AEROBIC AND ANAEROBIC 12CC EACH BOTTLE   Culture NO GROWTH 5 DAYS   Final   Report Status 09/04/2013 FINAL   Final  CULTURE, BLOOD (ROUTINE X 2)     Status: None   Collection Time    08/30/13 12:35 PM      Result Value Ref Range Status   Specimen Description BLOOD RIGHT HAND   Final   Special Requests     Final   Value: BOTTLES DRAWN AEROBIC AND ANAEROBIC 12CC EACH BOTTLE   Culture NO GROWTH 5 DAYS   Final   Report Status 09/04/2013 FINAL   Final  MRSA PCR SCREENING     Status: Abnormal   Collection Time    08/30/13  4:00 PM      Result Value Ref Range Status   MRSA by PCR POSITIVE (*) NEGATIVE Final   Comment:            The GeneXpert MRSA Assay (FDA     approved for NASAL specimens     only), is one component of a     comprehensive MRSA colonization     surveillance program. It is not     intended to diagnose MRSA     infection nor to guide or     monitor treatment for     MRSA infections.     RESULT CALLED TO, READ BACK BY AND VERIFIED WITH:     D.BREWER AT 1933 ON 08/30/13 BY S.VANHOORNE     Labs: Basic Metabolic Panel:  Recent Labs Lab 08/28/13 2320 08/30/13 1224 08/31/13 0619 09/01/13 0542 09/02/13 0615 09/03/13 0622  NA 132* 137 137 140 139  --   K 3.9 4.0 4.3 4.4 4.2  --   CL 96 99 99 101 99  --   CO2 33* 33*  --   GLUCOSE 149* 155* 125* 138* 127*  --   BUN --   CREATININE 0.97 0.86 0.72 0.70 0.74  --   CALCIUM 8.7 8.6 8.2* 8.5 8.4  --   MG  --   --   --    --   --  2.3   Liver Function Tests:  Recent Labs Lab 08/28/13 2320 08/30/13 1224  AST 16 13  ALT 10 9  ALKPHOS 57  58  BILITOT 1.1 0.6  PROT 6.9 6.7  ALBUMIN 3.0* 2.7*   CBC:  Recent Labs Lab 08/28/13 2320 08/30/13 1224 08/31/13 0619 09/02/13 0615  WBC 13.8* 8.1 6.2 7.8  NEUTROABS 12.0* 6.1  --   --   HGB 15.2 14.2 13.2 14.3  HCT 43.2 41.6 38.8* 41.9  MCV 85.7 86.8 87.4 87.7  PLT 155 131* 141* 211   Cardiac Enzymes:  Recent Labs Lab 08/30/13 1902 08/31/13 0027 08/31/13 0611  TROPONINI <0.30 <0.30 <0.30     Recent Labs  08/30/13 1902  PROBNP 1049.0*    Principal Problem:   Cellulitis Active Problems:   Long term (current) use of anticoagulants   Atrial fibrillation with RVR   Morbid obesity   Obstructive sleep apnea   Time coordinating discharge: 35 minutes  Signed:  [name], MD [facility] 09/04/2013, 11:34 AM

## 2013-09-04 NOTE — Progress Notes (Signed)
PROGRESS NOTE  [name]:[id number] DOB: 25-Apr-1950 DOA: 08/30/2013 PCP: Colette Ribas, MD  Summary: 63 year old man presented with several day history of erythema left lower extremity despite Levaquin. He is admitted for further treatment of left lower extremity cellulitis and atrial fibrillation with rapid ventricular response.  Assessment/Plan: 1. LLE cellulitis. Continues to improve. Allergies to vancomycin and clindamycin. [place] negative for DVT. Levaquin prior to admit. 2. Atrial fibrillation with RVR. Rate remains controlled. Maintained on Cardizem 360. Lopressor added this admission for RVR, rate very controlled, exacerbated by infection, will stop Lopressor now. Continue warfarin.  3. OSA, unable to tolerate CPAP 4. Morbid obesity   Plan discharge home today, change to oral antibiotics to cover strep and staph.  Hold warfarin today, tomorrow; plan outpatient followup with Coumadin clinic next week (closed Monday), risk for significant elevation on antibiotics fairly low with only 2 days of therapy left.  Counseled to monitor for worsening of infection or onset of bleeding.  Follow-up with Coumadin Clinic 9/9 @ 1:15 PM  [name], MD  [facility]  Pager [phone] If 7PM-7AM, please contact night-coverage at [email or web address], password [facility] 09/04/2013, 9:23 AM  LOS: 5 days   Consultants:  PT: HH PT  Procedures:  8/30 2-D echocardiogram with poor image quality, technically difficult. Systolic function could not be assessed, wall motion could not be assessed, diastolic dysfunction cannot be assessed.  Antibiotics:  Teflaro 8/29>> 9/2 Bactrim 9/3 > 9/6 Keflex 9/3 >> 9/6  HPI/Subjective: Continues to feel better. Dizziness has resolved. Minimal pain left lower leg.  Objective: Filed Vitals:   09/03/13 0648 09/03/13 1431 09/03/13 2138 09/04/13 0757  BP: 125/60 142/59 122/86 140/60  Pulse: 81 62 62 75  Temp: 98.4 F (36.9 C) 97.6 F (36.4 C)  97.6 F (36.4 C) 96.4 F (35.8 C)  TempSrc: Oral Oral Oral Axillary  Resp: Height:      Weight:      SpO2: 94% 94% 93% 94%    Intake/Output Summary (Last 24 hours) at 09/04/13 0923 Last data filed at 09/04/13 0757  Gross per 24 hour  Intake   1350 ml  Output   2550 ml  Net  -1200 ml     Filed Weights   08/31/13 0500 09/01/13 0500 09/01/13 2237  Weight: 212 kg (467 lb 6 oz) 210 kg (462 lb 15.5 oz) 208.655 kg (460 lb)    Exam:     Afebrile, vital signs stable. No hypoxia.  Gen. Appears calm and comfortable. Speech fluent and clear.  Cardiovascular. Regular rate and rhythm. No murmur, rub or gallop. No change in right lower extremity edema which is chronic. Left lower extremity without significant edema.  Respiratory clear to auscultation bilaterally. No wheezes, rales rhonchi. Normal respiratory effort.  Skin: Cellulitis continues to improve left lower extremity, no fluctuance. Minimal pain with palpation distal lower leg. No edema. Excellent strength, able to lift leg off bed.  Data Reviewed:  Excellent urine output.  INR 3.32.  Scheduled Meds: . ceFTAROline (TEFLARO) IV  600 mg Intravenous Q12H  . diltiazem  360 mg Oral Daily  . magnesium oxide  400 mg Oral Daily  . meclizine  25 mg Oral Daily  . metoprolol tartrate  25 mg Oral BID  . mupirocin ointment  1 application Nasal BID  . sodium chloride  3 mL Intravenous Q12H  . Warfarin - Pharmacist Dosing Inpatient   Does not apply Q24H   Continuous Infusions:    Principal  Problem:   Cellulitis Active Problems:   Long term (current) use of anticoagulants   Atrial fibrillation with RVR   Morbid obesity   Obstructive sleep apnea

## 2013-09-04 NOTE — Progress Notes (Signed)
Pt discharged home today with home health per Dr. Irene Limbo. Pt's IV site D/C'd and WDL. Pt's VSS. Pt provided with home medication list, discharge instructions and prescriptions. Verbalized understanding. Pt left floor via WC in stable condition accompanied by NT.

## 2013-09-04 NOTE — Progress Notes (Signed)
ANTICOAGULATION CONSULT NOTE  Pharmacy Consult for Warfarin Indication: atrial fibrillation  Allergies  Allergen Reactions  . Strawberry Hives  . Adhesive  [Tape] Rash    Paper tape OK.  . Clindamycin Rash    Raw skin.  . Tetracyclines & Related Rash  . Vancomycin Rash   Patient Measurements: Height:  (180.3 cm) Weight: 460 lb (208.655 kg) IBW/kg (Calculated) : 75.3  Vital Signs: Temp: 97.6 F (36.4 C) (09/02 2138) Temp src: Oral (09/02 2138) BP: 122/86 mmHg (09/02 2138) Pulse Rate: 62 (09/02 2138)  Labs:  Recent Labs  09/02/13 0615 09/03/13 0622 09/04/13 0551  HGB 14.3  --   --   HCT 41.9  --   --   PLT 211  --   --   LABPROT 30.0* 31.0* 33.7*  INR 2.86* 2.98* 3.32*  CREATININE 0.74  --   --    Estimated Creatinine Clearance: 169.8 ml/min (by C-G formula based on Cr of 0.74).  Medical History: Past Medical History  Diagnosis Date  . Obesity   . Hypertension   . Atrial fibrillation   . Cellulitis   . Hyperlipidemia   . Deep vein thrombosis     hx of   Medications:  Prescriptions prior to admission  Medication Sig Dispense Refill  . diazepam (VALIUM) 5 MG tablet Take 1 tablet (5 mg total) by mouth every 6 (six) hours as needed (dizzines).  20 tablet  0  . diltiazem (CARDIZEM LA) 360 MG 24 hr tablet Take 1 tablet (360 mg total) by mouth daily.  30 tablet  6  . ibuprofen (ADVIL,MOTRIN) 200 MG tablet Take 600 mg by mouth every 6 (six) hours as needed for pain.      Marland Kitchen levofloxacin (LEVAQUIN) 750 MG tablet Take 1 tablet (750 mg total) by mouth daily. X 7 days  7 tablet  0  . magnesium oxide (MAG-OX) 400 MG tablet Take 400 mg by mouth daily.      . meclizine (ANTIVERT) 25 MG tablet Take 25 mg by mouth daily.      Marland Kitchen warfarin (COUMADIN) 10 MG tablet Take 10 mg by mouth See admin instructions. Take 10 mg every Sun, Tues, Thur, Sat      . warfarin (COUMADIN) 7.5 MG tablet Take 7.5 mg by mouth every Monday, Wednesday, and Friday.       Assessment: 63 yo M  on chronic Coumadin for Afib.  Reported home dose listed above; per outpatient records patient should be taking  on MWF & 7.5mg  on TTSS.  INR therapeutic on admission, but has steadily increased & is slightly above goal range today.  No bleeding noted.   Patient was admitted with cellulitis & has been receiving antibiotic therapy.  Noted plan to discharge home today on oral antibiotics.    Goal of Therapy:  INR 2-3   Plan:  Hold warfarin today  Recommend resume Coumadin 9/4 at dose documented by [facility] (7.5mg  TTSS/  MWF) with follow-up INR monitoring as outpatient.   Updated home med list to reflect prescribed dose   Elson Clan 09/04/2013,7:48 AM

## 2013-09-11 ENCOUNTER — Telehealth: Payer: Self-pay | Admitting: *Deleted

## 2013-09-11 ENCOUNTER — Ambulatory Visit (INDEPENDENT_AMBULATORY_CARE_PROVIDER_SITE_OTHER): Payer: Medicare Other | Admitting: *Deleted

## 2013-09-11 DIAGNOSIS — Z5181 Encounter for therapeutic drug level monitoring: Secondary | ICD-10-CM

## 2013-09-11 DIAGNOSIS — I4891 Unspecified atrial fibrillation: Secondary | ICD-10-CM

## 2013-09-11 DIAGNOSIS — I482 Chronic atrial fibrillation, unspecified: Secondary | ICD-10-CM

## 2013-09-11 DIAGNOSIS — Z7901 Long term (current) use of anticoagulants: Secondary | ICD-10-CM

## 2013-09-11 LAB — POCT INR: INR: 2.2

## 2013-09-11 NOTE — Telephone Encounter (Signed)
See coumadin note. 

## 2013-09-11 NOTE — Telephone Encounter (Signed)
INR 2.2 DOSE IS 7.5 MG  MON, WED AND Friday AND 10 MG ALL OTHER

## 2013-09-18 ENCOUNTER — Ambulatory Visit (INDEPENDENT_AMBULATORY_CARE_PROVIDER_SITE_OTHER): Payer: Medicare Other | Admitting: *Deleted

## 2013-09-18 ENCOUNTER — Telehealth: Payer: Self-pay | Admitting: *Deleted

## 2013-09-18 DIAGNOSIS — Z5181 Encounter for therapeutic drug level monitoring: Secondary | ICD-10-CM

## 2013-09-18 DIAGNOSIS — I482 Chronic atrial fibrillation, unspecified: Secondary | ICD-10-CM

## 2013-09-18 DIAGNOSIS — Z7901 Long term (current) use of anticoagulants: Secondary | ICD-10-CM

## 2013-09-18 DIAGNOSIS — I4891 Unspecified atrial fibrillation: Secondary | ICD-10-CM

## 2013-09-18 LAB — POCT INR: INR: 3.3

## 2013-09-18 NOTE — Telephone Encounter (Signed)
INR 3.3

## 2013-09-18 NOTE — Telephone Encounter (Signed)
See coumadin note. 

## 2013-09-24 ENCOUNTER — Ambulatory Visit (INDEPENDENT_AMBULATORY_CARE_PROVIDER_SITE_OTHER): Payer: Medicare Other | Admitting: *Deleted

## 2013-09-24 DIAGNOSIS — Z5181 Encounter for therapeutic drug level monitoring: Secondary | ICD-10-CM

## 2013-09-24 DIAGNOSIS — Z7901 Long term (current) use of anticoagulants: Secondary | ICD-10-CM

## 2013-09-24 DIAGNOSIS — I482 Chronic atrial fibrillation, unspecified: Secondary | ICD-10-CM

## 2013-09-24 DIAGNOSIS — I4891 Unspecified atrial fibrillation: Secondary | ICD-10-CM

## 2013-09-24 LAB — POCT INR: INR: 3.3

## 2013-10-01 ENCOUNTER — Ambulatory Visit (INDEPENDENT_AMBULATORY_CARE_PROVIDER_SITE_OTHER): Payer: Medicare Other | Admitting: *Deleted

## 2013-10-01 ENCOUNTER — Telehealth: Payer: Self-pay | Admitting: *Deleted

## 2013-10-01 DIAGNOSIS — Z7901 Long term (current) use of anticoagulants: Secondary | ICD-10-CM

## 2013-10-01 DIAGNOSIS — I4891 Unspecified atrial fibrillation: Secondary | ICD-10-CM

## 2013-10-01 DIAGNOSIS — Z5181 Encounter for therapeutic drug level monitoring: Secondary | ICD-10-CM

## 2013-10-01 DIAGNOSIS — I482 Chronic atrial fibrillation, unspecified: Secondary | ICD-10-CM

## 2013-10-01 LAB — POCT INR: INR: 2.2

## 2013-10-01 NOTE — Telephone Encounter (Signed)
See coumadin note. 

## 2013-10-01 NOTE — Telephone Encounter (Signed)
INR 2.2

## 2013-10-03 ENCOUNTER — Telehealth: Payer: Self-pay | Admitting: *Deleted

## 2013-10-03 NOTE — Telephone Encounter (Signed)
Faxed orders r/t INR and dosing

## 2013-10-08 ENCOUNTER — Ambulatory Visit (INDEPENDENT_AMBULATORY_CARE_PROVIDER_SITE_OTHER): Payer: Medicare Other | Admitting: Cardiology

## 2013-10-08 DIAGNOSIS — I482 Chronic atrial fibrillation, unspecified: Secondary | ICD-10-CM

## 2013-10-08 DIAGNOSIS — Z5181 Encounter for therapeutic drug level monitoring: Secondary | ICD-10-CM

## 2013-10-08 LAB — POCT INR: INR: 1.7

## 2013-10-09 ENCOUNTER — Telehealth: Payer: Self-pay

## 2013-10-09 NOTE — Telephone Encounter (Signed)
Faxed physician verbal order for INR checks to [facility]. (Phone [phone], Fax [phone])

## 2013-10-15 ENCOUNTER — Telehealth: Payer: Self-pay | Admitting: *Deleted

## 2013-10-15 ENCOUNTER — Ambulatory Visit (INDEPENDENT_AMBULATORY_CARE_PROVIDER_SITE_OTHER): Payer: Medicare Other | Admitting: *Deleted

## 2013-10-15 DIAGNOSIS — Z5181 Encounter for therapeutic drug level monitoring: Secondary | ICD-10-CM

## 2013-10-15 DIAGNOSIS — I482 Chronic atrial fibrillation, unspecified: Secondary | ICD-10-CM

## 2013-10-15 LAB — POCT INR: INR: 2.9

## 2013-10-15 NOTE — Telephone Encounter (Signed)
INR 2.9 / please call Lowella BandyNikki w/ instructions / tgs

## 2013-10-15 NOTE — Telephone Encounter (Signed)
See coumadin note. 

## 2013-11-12 ENCOUNTER — Ambulatory Visit (INDEPENDENT_AMBULATORY_CARE_PROVIDER_SITE_OTHER): Payer: Medicare Other | Admitting: *Deleted

## 2013-11-12 DIAGNOSIS — I482 Chronic atrial fibrillation, unspecified: Secondary | ICD-10-CM

## 2013-11-12 DIAGNOSIS — Z5181 Encounter for therapeutic drug level monitoring: Secondary | ICD-10-CM

## 2013-11-12 LAB — POCT INR: INR: 2.1

## 2013-12-03 ENCOUNTER — Ambulatory Visit (INDEPENDENT_AMBULATORY_CARE_PROVIDER_SITE_OTHER): Payer: Medicare Other | Admitting: *Deleted

## 2013-12-03 DIAGNOSIS — Z7901 Long term (current) use of anticoagulants: Secondary | ICD-10-CM

## 2013-12-03 DIAGNOSIS — I482 Chronic atrial fibrillation, unspecified: Secondary | ICD-10-CM

## 2013-12-03 DIAGNOSIS — Z5181 Encounter for therapeutic drug level monitoring: Secondary | ICD-10-CM

## 2013-12-03 LAB — POCT INR: INR: 3

## 2013-12-30 ENCOUNTER — Other Ambulatory Visit: Payer: Self-pay | Admitting: Internal Medicine

## 2014-01-07 ENCOUNTER — Ambulatory Visit (INDEPENDENT_AMBULATORY_CARE_PROVIDER_SITE_OTHER): Payer: Medicare Other | Admitting: *Deleted

## 2014-01-07 DIAGNOSIS — I482 Chronic atrial fibrillation, unspecified: Secondary | ICD-10-CM

## 2014-01-07 DIAGNOSIS — Z7901 Long term (current) use of anticoagulants: Secondary | ICD-10-CM

## 2014-01-07 DIAGNOSIS — Z5181 Encounter for therapeutic drug level monitoring: Secondary | ICD-10-CM

## 2014-01-07 LAB — POCT INR: INR: 2.6

## 2014-02-04 ENCOUNTER — Ambulatory Visit (INDEPENDENT_AMBULATORY_CARE_PROVIDER_SITE_OTHER): Payer: Medicare Other | Admitting: *Deleted

## 2014-02-04 DIAGNOSIS — Z5181 Encounter for therapeutic drug level monitoring: Secondary | ICD-10-CM

## 2014-02-04 DIAGNOSIS — Z7901 Long term (current) use of anticoagulants: Secondary | ICD-10-CM

## 2014-02-04 DIAGNOSIS — I482 Chronic atrial fibrillation, unspecified: Secondary | ICD-10-CM

## 2014-02-04 LAB — POCT INR: INR: 3

## 2014-02-06 DIAGNOSIS — Z79899 Other long term (current) drug therapy: Secondary | ICD-10-CM | POA: Insufficient documentation

## 2014-02-06 DIAGNOSIS — Z792 Long term (current) use of antibiotics: Secondary | ICD-10-CM | POA: Diagnosis not present

## 2014-02-06 DIAGNOSIS — B379 Candidiasis, unspecified: Secondary | ICD-10-CM | POA: Insufficient documentation

## 2014-02-06 DIAGNOSIS — Z7901 Long term (current) use of anticoagulants: Secondary | ICD-10-CM | POA: Insufficient documentation

## 2014-02-06 DIAGNOSIS — R109 Unspecified abdominal pain: Secondary | ICD-10-CM | POA: Diagnosis present

## 2014-02-06 DIAGNOSIS — I10 Essential (primary) hypertension: Secondary | ICD-10-CM | POA: Diagnosis not present

## 2014-02-06 DIAGNOSIS — Z86718 Personal history of other venous thrombosis and embolism: Secondary | ICD-10-CM | POA: Insufficient documentation

## 2014-02-06 DIAGNOSIS — Z872 Personal history of diseases of the skin and subcutaneous tissue: Secondary | ICD-10-CM | POA: Insufficient documentation

## 2014-02-06 DIAGNOSIS — Z87891 Personal history of nicotine dependence: Secondary | ICD-10-CM | POA: Insufficient documentation

## 2014-02-06 DIAGNOSIS — I4891 Unspecified atrial fibrillation: Secondary | ICD-10-CM | POA: Diagnosis not present

## 2014-02-07 ENCOUNTER — Emergency Department (HOSPITAL_COMMUNITY)
Admission: EM | Admit: 2014-02-07 | Discharge: 2014-02-07 | Disposition: A | Discharge status: Home / Self Care | Payer: Medicare Other | Attending: Emergency Medicine | Admitting: Emergency Medicine

## 2014-02-07 ENCOUNTER — Encounter (HOSPITAL_COMMUNITY): Payer: Self-pay | Admitting: *Deleted

## 2014-02-07 DIAGNOSIS — B379 Candidiasis, unspecified: Secondary | ICD-10-CM

## 2014-02-07 MED ORDER — CLOTRIMAZOLE 1 % EX CREA: TOPICAL_CREAM | CUTANEOUS | Status: AC

## 2014-02-07 NOTE — ED Provider Notes (Signed)
CSN: [id number]     Arrival date & time 02/06/14  2349 History   First MD Initiated Contact with Patient 02/07/14 0041     No chief complaint on file.    (Consider location/radiation/quality/duration/timing/severity/associated sxs/prior Treatment) The history is provided by the patient.   [name] is a 64 y.o. male who presents to the ED with a wound in the left groin area. Patient states he first noticed the area about a week ago. He decided he better come in before it got infected. He is morbidly obese and has someone that comes in to assist with his care.  Past Medical History  Diagnosis Date  . Obesity   . Hypertension   . Atrial fibrillation   . Cellulitis   . Hyperlipidemia   . Deep vein thrombosis     hx of   Past Surgical History  Procedure Laterality Date  . Abdominal surgery    . Panniculectomy     Family History  Problem Relation Age of Onset  . Cancer Mother    History  Substance Use Topics  . Smoking status: Former Smoker    Quit date: 01/02/1993  . Smokeless tobacco: Not on file     Comment: quit 2012  . Alcohol Use: No     Comment: occasional    Review of Systems Negative except as stated in HPI   Allergies  Strawberry; Adhesive ; Clindamycin; Tetracyclines & related; and Vancomycin  Home Medications   Prior to Admission medications   Medication Sig Start Date End Date Taking? Authorizing Provider  cephALEXin (KEFLEX) 500 MG capsule Take 1 capsule (500 mg total) by mouth 4 (four) times daily. 09/04/13   Standley Brooking, MD  clotrimazole (LOTRIMIN) 1 % cream Apply to affected area 2 times daily 02/07/14   [facility], NP  diazepam (VALIUM) 5 MG tablet Take 1 tablet (5 mg total) by mouth every 6 (six) hours as needed (dizzines). 08/29/13   Carlisle Beers Molpus, MD  diltiazem (CARDIZEM LA) 360 MG 24 hr tablet Take 1 tablet (360 mg total) by mouth daily. 08/20/13   Chrystie Nose, MD  HYDROcodone-acetaminophen (NORCO) 10-325 MG per tablet Take 1 tablet  by mouth every 6 (six) hours as needed for moderate pain. 09/04/13   Standley Brooking, MD  magnesium oxide (MAG-OX) 400 MG tablet Take 400 mg by mouth daily.    Historical Provider, MD  meclizine (ANTIVERT) 25 MG tablet Take 25 mg by mouth daily.    Historical Provider, MD  sulfamethoxazole-trimethoprim (BACTRIM DS) 800-160 MG per tablet Take 1 tablet by mouth every 12 (twelve) hours. 09/04/13   Standley Brooking, MD  warfarin (COUMADIN) 10 MG tablet Take 10 mg by mouth every Monday, Wednesday, and Friday.     Historical Provider, MD  warfarin (COUMADIN) 5 MG tablet TAKE 1&1/2 TO 2 TABLETS BY MOUTH DAILY AS DIRECTED. 12/30/13   Chrystie Nose, MD  warfarin (COUMADIN) 7.5 MG tablet Take 7.5 mg by mouth 4 (four) times a week. Take 7.5mg  on Tuesday, Thursday, Saturday, & Sunday.  Take  dose on other days of week.    Historical Provider, MD   BP 156/66 mmHg  Pulse 64  Temp(Src) 98.4 F (36.9 C) (Oral)  Resp 22  Ht  (1.803 m)  Wt 470 lb (213.191 kg)  BMI 65.58 kg/m2  SpO2 94% Physical Exam  Constitutional: He is oriented to person, place, and time. He appears well-developed and well-nourished. No distress.  HENT:  Head: Normocephalic.  Eyes: EOM are normal.  Neck: Neck supple.  Cardiovascular: Normal rate.   Pulmonary/Chest: Effort normal.  Abdominal: Soft. There is no tenderness.    Morbidly obese. There is a large pannus There is an open wound to the left inguinal area. There is no surrounding erythema or streaking noted. There is a monilia smell to the area.   Musculoskeletal: Normal range of motion.  Neurological: He is alert and oriented to person, place, and time. No cranial nerve deficit.  Skin: Skin is warm and dry.  Psychiatric: He has a normal mood and affect. His behavior is normal.  Nursing note and vitals reviewed.   ED Course  Procedures   MDM  64 y.o. male who is morbidly obese and has a large pannus complains of an open wound and wetness to the left  inguinal area. Will treat for monilia. Stable for d/c without signs of infection.  Final diagnoses:  Monilia infection     [facility] Neese, NP 02/07/14 0123  Dione Boozeavid Glick, MD 02/07/14 [phone]

## 2014-02-07 NOTE — Discharge Instructions (Signed)
Use the cream as directed. You may also want to use a powder to help dry the area as well. Follow up with your doctor or return as needed for worsening symptoms.

## 2014-02-07 NOTE — ED Notes (Signed)
Pt reporting wound in groin area.

## 2014-02-09 ENCOUNTER — Emergency Department (HOSPITAL_COMMUNITY): Payer: Medicare Other

## 2014-02-09 ENCOUNTER — Emergency Department (HOSPITAL_COMMUNITY)
Admission: EM | Admit: 2014-02-09 | Discharge: 2014-02-09 | Disposition: A | Discharge status: Home / Self Care | Payer: Medicare Other | Attending: Emergency Medicine | Admitting: Emergency Medicine

## 2014-02-09 ENCOUNTER — Encounter (HOSPITAL_COMMUNITY): Payer: Self-pay | Admitting: *Deleted

## 2014-02-09 DIAGNOSIS — Z7901 Long term (current) use of anticoagulants: Secondary | ICD-10-CM | POA: Insufficient documentation

## 2014-02-09 DIAGNOSIS — R410 Disorientation, unspecified: Secondary | ICD-10-CM | POA: Insufficient documentation

## 2014-02-09 DIAGNOSIS — R062 Wheezing: Secondary | ICD-10-CM | POA: Diagnosis not present

## 2014-02-09 DIAGNOSIS — I10 Essential (primary) hypertension: Secondary | ICD-10-CM | POA: Diagnosis not present

## 2014-02-09 DIAGNOSIS — Z79899 Other long term (current) drug therapy: Secondary | ICD-10-CM | POA: Diagnosis not present

## 2014-02-09 DIAGNOSIS — L03314 Cellulitis of groin: Secondary | ICD-10-CM | POA: Diagnosis not present

## 2014-02-09 DIAGNOSIS — I4891 Unspecified atrial fibrillation: Secondary | ICD-10-CM | POA: Diagnosis not present

## 2014-02-09 DIAGNOSIS — E669 Obesity, unspecified: Secondary | ICD-10-CM | POA: Insufficient documentation

## 2014-02-09 DIAGNOSIS — Z87891 Personal history of nicotine dependence: Secondary | ICD-10-CM | POA: Diagnosis not present

## 2014-02-09 DIAGNOSIS — L03818 Cellulitis of other sites: Secondary | ICD-10-CM

## 2014-02-09 DIAGNOSIS — Z7982 Long term (current) use of aspirin: Secondary | ICD-10-CM | POA: Insufficient documentation

## 2014-02-09 DIAGNOSIS — Z86718 Personal history of other venous thrombosis and embolism: Secondary | ICD-10-CM | POA: Insufficient documentation

## 2014-02-09 DIAGNOSIS — R110 Nausea: Secondary | ICD-10-CM | POA: Diagnosis present

## 2014-02-09 LAB — COMPREHENSIVE METABOLIC PANEL
ALT: 19 U/L (ref 0–53)
ANION GAP: 6 (ref 5–15)
AST: 26 U/L (ref 0–37)
Albumin: 3.7 g/dL (ref 3.5–5.2)
Alkaline Phosphatase: 58 U/L (ref 39–117)
BILIRUBIN TOTAL: 1.5 mg/dL — AB (ref 0.3–1.2)
BUN: 20 mg/dL (ref 6–23)
CHLORIDE: 103 mmol/L (ref 96–112)
CO2: 25 mmol/L (ref 19–32)
Calcium: 8.6 mg/dL (ref 8.4–10.5)
Creatinine, Ser: 1 mg/dL (ref 0.50–1.35)
GFR calc Af Amer: 90 mL/min — ABNORMAL LOW (ref >90)
GFR calc non Af Amer: 78 mL/min — ABNORMAL LOW (ref >90)
GLUCOSE: 174 mg/dL — AB (ref 70–99)
Potassium: 3.9 mmol/L (ref 3.5–5.1)
SODIUM: 134 mmol/L — AB (ref 135–145)
TOTAL PROTEIN: 7.6 g/dL (ref 6.0–8.3)

## 2014-02-09 LAB — CBC WITH DIFFERENTIAL/PLATELET
Basophils Absolute: 0 10*3/uL (ref 0.0–0.1)
Basophils Relative: 0 % (ref 0–1)
EOS PCT: 0 % (ref 0–5)
Eosinophils Absolute: 0 10*3/uL (ref 0.0–0.7)
HCT: 47.8 % (ref 39.0–52.0)
Hemoglobin: 16.5 g/dL (ref 13.0–17.0)
LYMPHS ABS: 1 10*3/uL (ref 0.7–4.0)
LYMPHS PCT: 6 % — AB (ref 12–46)
MCH: 30.4 pg (ref 26.0–34.0)
MCHC: 34.5 g/dL (ref 30.0–36.0)
MCV: 88.2 fL (ref 78.0–100.0)
MONO ABS: 0.8 10*3/uL (ref 0.1–1.0)
Monocytes Relative: 5 % (ref 3–12)
Neutro Abs: 15.1 10*3/uL — ABNORMAL HIGH (ref 1.7–7.7)
Neutrophils Relative %: 89 % — ABNORMAL HIGH (ref 43–77)
Platelets: 152 10*3/uL (ref 150–400)
RBC: 5.42 MIL/uL (ref 4.22–5.81)
RDW: 14.4 % (ref 11.5–15.5)
WBC: 16.9 10*3/uL — ABNORMAL HIGH (ref 4.0–10.5)

## 2014-02-09 MED ORDER — CEPHALEXIN 500 MG PO CAPS: 500.0000 mg | ORAL_CAPSULE | Freq: Four times a day (QID) | ORAL | Status: AC

## 2014-02-09 MED ORDER — OXYCODONE-ACETAMINOPHEN 5-325 MG PO TABS: 1.0000 | ORAL_TABLET | ORAL | Status: DC | PRN

## 2014-02-09 MED ORDER — ACETAMINOPHEN 325 MG PO TABS
650.0000 mg | ORAL_TABLET | Freq: Once | ORAL | Status: AC
  Administered 2014-02-09: 650 mg via ORAL
  Filled 2014-02-09: qty 2

## 2014-02-09 MED ORDER — SULFAMETHOXAZOLE-TRIMETHOPRIM 800-160 MG PO TABS
1.0000 | ORAL_TABLET | Freq: Once | ORAL | Status: AC
  Administered 2014-02-09: 1 via ORAL
  Filled 2014-02-09: qty 1

## 2014-02-09 MED ORDER — CEPHALEXIN 500 MG PO CAPS
500.0000 mg | ORAL_CAPSULE | Freq: Once | ORAL | Status: AC
Start: 2014-02-09 — End: 2014-02-09
  Administered 2014-02-09: 500 mg via ORAL
  Filled 2014-02-09: qty 1

## 2014-02-09 MED ORDER — SULFAMETHOXAZOLE-TMP DS 800-160 MG PO TABS: 1.0000 | ORAL_TABLET | Freq: Two times a day (BID) | ORAL | Status: AC

## 2014-02-09 NOTE — ED Provider Notes (Signed)
CSN: [id number]     Arrival date & time 02/09/14  1508 History  This chart was scribed for Randall Melter, MD by Ronney Lion, ED Scribe. This patient was seen in room APA07/APA07 and the patient's care was started at 3:54 PM.    Chief Complaint  Patient presents with  . Nausea   The history is provided by the patient. No language interpreter was used.     HPI Comments: [name] is a 64 y.o. male who presents to the Emergency Department complaining of nausea that began 2 days ago. Patient complains of fever and chills for the past 2 days up until this morning, fatigue (he has been sleeping frequently), abdominal pain, and confusion (per family). Patient's last BM was 2 days ago. He didn't eat all weekend due to nausea. He reports he has been taking the medication he was prescribed here when he was last seen here 2 days ago for an open wound in his left inguinal area. He is able to ambulate with a walker. Patient has Afib, for whch he takes Warfarin. He has monthly INR checks. He denies vomiting or diarrhea. [name] is patient's PCP.    Past Medical History  Diagnosis Date  . Obesity   . Hypertension   . Atrial fibrillation   . Cellulitis   . Hyperlipidemia   . Deep vein thrombosis     hx of   Past Surgical History  Procedure Laterality Date  . Abdominal surgery    . Panniculectomy     Family History  Problem Relation Age of Onset  . Cancer Mother    History  Substance Use Topics  . Smoking status: Former Smoker    Quit date: 01/02/1993  . Smokeless tobacco: Not on file     Comment: quit 2012  . Alcohol Use: No     Comment: occasional    Review of Systems  Constitutional: Positive for fever, chills and fatigue.  Gastrointestinal: Positive for nausea and abdominal pain. Negative for vomiting and diarrhea.  Psychiatric/Behavioral: Positive for confusion.  All other systems reviewed and are negative.     Allergies  Strawberry; Adhesive ; Clindamycin;  Tetracyclines & related; and Vancomycin  Home Medications   Prior to Admission medications   Medication Sig Start Date End Date Taking? Authorizing Provider  aspirin EC 325 MG tablet Take 325 mg by mouth daily.   Yes Historical Provider, MD  clotrimazole (LOTRIMIN) 1 % cream Apply to affected area 2 times daily Patient taking differently: Apply 1 application topically 2 (two) times daily. Apply to affected area 2 times daily 02/07/14  Yes Hope Orlene Och, NP  diltiazem (CARDIZEM LA) 360 MG 24 hr tablet Take 1 tablet (360 mg total) by mouth daily. 08/20/13  Yes Chrystie Nose, MD  HYDROcodone-acetaminophen (NORCO) 10-325 MG per tablet Take 1 tablet by mouth every 6 (six) hours as needed for moderate pain. 09/04/13  Yes Standley Brooking, MD  warfarin (COUMADIN) 5 MG tablet TAKE 1&1/2 TO 2 TABLETS BY MOUTH DAILY AS DIRECTED. Patient taking differently: TAKE ONE AND ONE-HALF TABLET ON SUNDAYS, TUESDAYS, AND THURSDAYS FOR A TOTAL OF 7.5MG . TAKE TWO TABLETS ON ALL OTHER DAYS FOR A TOTAL OF  12/30/13  Yes Chrystie Nose, MD  cephALEXin (KEFLEX) 500 MG capsule Take 1 capsule (500 mg total) by mouth 4 (four) times daily. 02/09/14   Randall Melter, MD  oxyCODONE-acetaminophen (PERCOCET) 5-325 MG per tablet Take 1 tablet by mouth every 4 (four)  hours as needed for severe pain. 02/09/14   Randall MelterElliott L Hudsyn Barich, MD  sulfamethoxazole-trimethoprim (BACTRIM DS) 800-160 MG per tablet Take 1 tablet by mouth every 12 (twelve) hours. 02/09/14   Randall MelterElliott L Quanisha Drewry, MD   BP 117/73 mmHg  Pulse 61  Temp(Src) 98.4 F (36.9 C) (Oral)  Resp 23  Ht 5' 11.5" (1.816 m)  Wt 470 lb (213.191 kg)  BMI 64.65 kg/m2  SpO2 93% Physical Exam  Constitutional: He is oriented to person, place, and time. He appears well-developed and well-nourished.  HENT:  Head: Normocephalic and atraumatic.  Right Ear: External ear normal.  Left Ear: External ear normal.  Eyes: Conjunctivae and EOM are normal. Pupils are equal, round, and reactive to  light.  Neck: Normal range of motion and phonation normal. Neck supple.  Cardiovascular: Normal rate and normal heart sounds.   Pulmonary/Chest: Effort normal. He has wheezes. He exhibits no bony tenderness.  Good air movement with a few scattered wheezes.  Abdominal: Soft. There is tenderness. There is no rebound.  Mild mid abdominal tenderness without rebound. There's no mass.  Panniculus on the right side has mild erythema, without open area or drainage.  Left groin has a 3.5 cm area which is open and healing with normal granulation tissue. No draining or bleeding.  Genitourinary:  External genitals appear normal.   Musculoskeletal: Normal range of motion.  Bilateral pedal edema, right greater than left.  Neurological: He is alert and oriented to person, place, and time. No cranial nerve deficit or sensory deficit. He exhibits normal muscle tone. Coordination normal.  Skin: Skin is warm, dry and intact.  Psychiatric: He has a normal mood and affect. His behavior is normal. Judgment and thought content normal.  Nursing note and vitals reviewed.   ED Course  Procedures (including critical care time)  DIAGNOSTIC STUDIES: Oxygen Saturation is 92% on room air, adequate by my interpretation.    COORDINATION OF CARE: 4:01 PM - Discussed treatment plan with pt at bedside which includes blood tests and UA, and pt agreed to plan.   Medications  acetaminophen (TYLENOL) tablet 650 mg (650 mg Oral Given 02/09/14 1648)  cephALEXin (KEFLEX) capsule 500 mg (500 mg Oral Given 02/09/14 1739)  sulfamethoxazole-trimethoprim (BACTRIM DS,SEPTRA DS) 800-160 MG per tablet 1 tablet (1 tablet Oral Given 02/09/14 1739)    Patient Vitals for the past 24 hrs:  BP Temp Temp src Pulse Resp SpO2 Height Weight  02/09/14 1921 117/73 mmHg - - 61 23 93 % - -  02/09/14 1800 (!) 105/45 mmHg - - - (!) 32 - - -  02/09/14 1745 - - - (!) 48 (!) 33 96 % - -  02/09/14 1630 110/69 mmHg - - 102 (!) 34 92 % - -  02/09/14 1518  (!) 143/33 mmHg 98.4 F (36.9 C) Oral (!) 51 (!) 32 92 % 5' 11.5" (1.816 m) (!) 470 lb (213.191 kg)    7:53 PM Reevaluation with update and discussion. After initial assessment and treatment, an updated evaluation reveals , at this time, he has tolerated his full meal.  No further complaints.  Findings discussed with patient and family member, son, all questions answered.Mancel Bale. Ruthmary Occhipinti L    Labs Review Labs Reviewed  CBC WITH DIFFERENTIAL/PLATELET - Abnormal; Notable for the following:    WBC 16.9 (*)    Neutrophils Relative % 89 (*)    Neutro Abs 15.1 (*)    Lymphocytes Relative 6 (*)    All other components within normal limits  COMPREHENSIVE METABOLIC PANEL - Abnormal; Notable for the following:    Sodium 134 (*)    Glucose, Bld 174 (*)    Total Bilirubin 1.5 (*)    GFR calc non Af Amer 78 (*)    GFR calc Af Amer 90 (*)    All other components within normal limits    Imaging Review Dg Chest 2 View  02/09/2014   CLINICAL DATA:  Fever and nausea for 2 days. Hypertension. Atrial fibrillation.  EXAM: CHEST  2 VIEW  COMPARISON:  08/30/2013  FINDINGS: Stable enlargement of the cardiopericardial silhouette. No edema or pleural effusion identified. Mild thoracic spondylosis. Body habitus reduces diagnostic sensitivity and specificity. No airspace opacity identified.  IMPRESSION: 1. Stable enlargement of the cardiopericardial silhouette, without edema or airspace opacity observed.   Electronically Signed   By: Herbie Baltimore M.D.   On: 02/09/2014 16:47     EKG Interpretation   Date/Time:  Monday February 09 2014 15:40:43 EST Ventricular Rate:  103 PR Interval:    QRS Duration: 107 QT Interval:  351 QTC Calculation: 459 R Axis:   -175 Text Interpretation:  Atrial fibrillation Ventricular premature complex  Right axis deviation Low voltage, precordial leads Since last tracing rate  slower Confirmed by Tiny Rietz  MD, Almeta Geisel ([id number]) on 02/09/2014 3:44:53 PM      MDM   Final  diagnoses:  Cellulitis of other specified site    Abdominal wall cellulitis, likely related to open wound, left groin.  No evidence for serious bacterial infection, sepsis, metabolic instability or impending vascular collapse.  His nausea is improved.  He is tolerating oral food, and medications.  Nursing Notes Reviewed/ Care Coordinated Applicable Imaging Reviewed Interpretation of Laboratory Data incorporated into ED treatment  The patient appears reasonably screened and/or stabilized for discharge and I doubt any other medical condition or other [facility] requiring further screening, evaluation, or treatment in the ED at this time prior to discharge.  Plan: Home Medications- Septra, Keflex, Percocet; Home Treatments- heat treatments for affected areas; return here if the recommended treatment, does not improve the symptoms; Recommended follow up- PCP check up in 3 days.   I personally performed the services described in this documentation, which was scribed in my presence. The recorded information has been reviewed and is accurate.       Randall Melter, MD 02/09/14 [phone]

## 2014-02-09 NOTE — Discharge Instructions (Signed)
Use moist heat on the left groin wound, 3 times a day Use heat heating pad on the abdominal wall area that is reddened, 3 times a day, for 30 minutes. See your doctor for checkup in 2 or 3 days.    Cellulitis Cellulitis is an infection of the skin and the tissue under the skin. The infected area is usually red and tender. This happens most often in the arms and lower legs. HOME CARE   Take your antibiotic medicine as told. Finish the medicine even if you start to feel better.  Keep the infected arm or leg raised (elevated).  Put a warm cloth on the area up to 4 times per day.  Only take medicines as told by your doctor.  Keep all doctor visits as told. GET HELP IF:  You see red streaks on the skin coming from the infected area.  Your red area gets bigger or turns a dark color.  Your bone or joint under the infected area is painful after the skin heals.  Your infection comes back in the same area or different area.  You have a puffy (swollen) bump in the infected area.  You have new symptoms.  You have a fever. GET HELP RIGHT AWAY IF:   You feel very sleepy.  You throw up (vomit) or have watery poop (diarrhea).  You feel sick and have muscle aches and pains. MAKE SURE YOU:   Understand these instructions.  Will watch your condition.  Will get help right away if you are not doing well or get worse. Document Released: 06/07/2007 Document Revised: 05/05/2013 Document Reviewed: 03/06/2011 [facility] Patient Information 2015 [place]. This information is not intended to replace advice given to you by your health care provider. Make sure you discuss any questions you have with your health care provider.

## 2014-02-09 NOTE — ED Notes (Signed)
Pt c/o headache 7/10

## 2014-02-09 NOTE — ED Notes (Addendum)
Nausea, no vomiting. No diarrhea  , thinks he has had a fever  tachypnea

## 2014-02-09 NOTE — ED Notes (Signed)
Pt eating supper tray 

## 2014-03-18 ENCOUNTER — Ambulatory Visit (INDEPENDENT_AMBULATORY_CARE_PROVIDER_SITE_OTHER): Payer: Medicare Other | Admitting: *Deleted

## 2014-03-18 DIAGNOSIS — Z7901 Long term (current) use of anticoagulants: Secondary | ICD-10-CM

## 2014-03-18 DIAGNOSIS — I482 Chronic atrial fibrillation, unspecified: Secondary | ICD-10-CM

## 2014-03-18 DIAGNOSIS — Z5181 Encounter for therapeutic drug level monitoring: Secondary | ICD-10-CM

## 2014-03-18 LAB — POCT INR: INR: 3.2

## 2014-03-20 ENCOUNTER — Telehealth: Payer: Self-pay | Admitting: *Deleted

## 2014-03-20 NOTE — Telephone Encounter (Signed)
Returned call to pt, pt has an appt on Tuesday 03/24/14 to see dentist.  Advised pt simple tooth extractions can be done while on Coumadin.  It is not always necessary to hold Coumadin prior to dental extractions.  Advised pt his dentist will be the one who will advise pt if he needs to hold Coumadin prior to procedure depending on bleeding risks associated with the procedure.  Advised pt if dentist wants pt to hold Coumadin prior to extraction [facility] get clearance from his cardiologist to hold Coumadin.  Pt verbalized understanding.

## 2014-03-20 NOTE — Telephone Encounter (Signed)
Pt is having dental work done nothing is set up yet but needs to know when to stop his coumadin before having tooth pulled/tmj

## 2014-04-09 ENCOUNTER — Other Ambulatory Visit: Payer: Self-pay | Admitting: Internal Medicine

## 2014-04-22 ENCOUNTER — Ambulatory Visit (INDEPENDENT_AMBULATORY_CARE_PROVIDER_SITE_OTHER): Payer: Medicare Other | Admitting: *Deleted

## 2014-04-22 DIAGNOSIS — Z5181 Encounter for therapeutic drug level monitoring: Secondary | ICD-10-CM

## 2014-04-22 DIAGNOSIS — I482 Chronic atrial fibrillation, unspecified: Secondary | ICD-10-CM

## 2014-04-22 DIAGNOSIS — Z7901 Long term (current) use of anticoagulants: Secondary | ICD-10-CM | POA: Diagnosis not present

## 2014-04-22 LAB — POCT INR: INR: 3.5

## 2014-04-28 ENCOUNTER — Encounter (HOSPITAL_COMMUNITY): Payer: Self-pay | Admitting: Cardiology

## 2014-04-28 ENCOUNTER — Emergency Department (HOSPITAL_COMMUNITY)
Admission: EM | Admit: 2014-04-28 | Discharge: 2014-04-28 | Disposition: A | Discharge status: Home / Self Care | Payer: Medicare Other | Attending: Emergency Medicine | Admitting: Emergency Medicine

## 2014-04-28 DIAGNOSIS — Z86711 Personal history of pulmonary embolism: Secondary | ICD-10-CM | POA: Insufficient documentation

## 2014-04-28 DIAGNOSIS — Z7901 Long term (current) use of anticoagulants: Secondary | ICD-10-CM | POA: Diagnosis not present

## 2014-04-28 DIAGNOSIS — Z792 Long term (current) use of antibiotics: Secondary | ICD-10-CM | POA: Insufficient documentation

## 2014-04-28 DIAGNOSIS — I10 Essential (primary) hypertension: Secondary | ICD-10-CM | POA: Insufficient documentation

## 2014-04-28 DIAGNOSIS — Z7982 Long term (current) use of aspirin: Secondary | ICD-10-CM | POA: Insufficient documentation

## 2014-04-28 DIAGNOSIS — Z87891 Personal history of nicotine dependence: Secondary | ICD-10-CM | POA: Insufficient documentation

## 2014-04-28 DIAGNOSIS — L02512 Cutaneous abscess of left hand: Secondary | ICD-10-CM | POA: Diagnosis present

## 2014-04-28 DIAGNOSIS — R0682 Tachypnea, not elsewhere classified: Secondary | ICD-10-CM | POA: Diagnosis not present

## 2014-04-28 DIAGNOSIS — E669 Obesity, unspecified: Secondary | ICD-10-CM | POA: Insufficient documentation

## 2014-04-28 MED ORDER — OXYCODONE-ACETAMINOPHEN 5-325 MG PO TABS: 1.0000 | ORAL_TABLET | Freq: Four times a day (QID) | ORAL | Status: AC | PRN

## 2014-04-28 MED ORDER — HYDROCODONE-ACETAMINOPHEN 5-325 MG PO TABS
2.0000 | ORAL_TABLET | Freq: Once | ORAL | Status: AC
  Administered 2014-04-28: 2 via ORAL
  Filled 2014-04-28: qty 2

## 2014-04-28 MED ORDER — LIDOCAINE HCL (PF) 1 % IJ SOLN
INTRAMUSCULAR | Status: AC
  Administered 2014-04-28: 5 mL
  Filled 2014-04-28: qty 5

## 2014-04-28 MED ORDER — ONDANSETRON HCL 4 MG PO TABS
4.0000 mg | ORAL_TABLET | Freq: Once | ORAL | Status: AC
  Administered 2014-04-28: 4 mg via ORAL
  Filled 2014-04-28: qty 1

## 2014-04-28 MED ORDER — LIDOCAINE HCL (PF) 1 % IJ SOLN
INTRAMUSCULAR | Status: AC
  Administered 2014-04-28: 12:00:00
  Filled 2014-04-28: qty 5

## 2014-04-28 MED ORDER — CEFTRIAXONE SODIUM 1 G IJ SOLR
1.0000 g | Freq: Once | INTRAMUSCULAR | Status: AC
  Administered 2014-04-28: 1 g via INTRAMUSCULAR
  Filled 2014-04-28: qty 10

## 2014-04-28 MED ORDER — AMOXICILLIN-POT CLAVULANATE 875-125 MG PO TABS: 1.0000 | ORAL_TABLET | Freq: Two times a day (BID) | ORAL | Status: AC

## 2014-04-28 NOTE — ED Provider Notes (Signed)
CSN: [id number]     Arrival date & time 04/28/14  0944 History   First MD Initiated Contact with Patient 04/28/14 1000     Chief Complaint  Patient presents with  . Abscess     (Consider location/radiation/quality/duration/timing/severity/associated sxs/prior Treatment) HPI Comments: Patient is a 64 year old male who presents to the emergency department with increased redness and swelling of his left hand.  The patient states this started about 3 days ago. He thinks that something may have bit him on his ring finger. The following day he noticed increased redness and swelling. Yesterday he states that he had met major swelling and redness of the ring finger and also the back of the hand. He does not recall chills or fever. He did not measure a temperature at that time. He has not had nausea or vomiting related to this. He has pain and soreness of the finger and pain with making a fist.  Patient is a 64 y.o. male presenting with abscess. The history is provided by the patient.  Abscess   Past Medical History  Diagnosis Date  . Obesity   . Hypertension   . Atrial fibrillation   . Cellulitis   . Hyperlipidemia   . Deep vein thrombosis     hx of   Past Surgical History  Procedure Laterality Date  . Abdominal surgery    . Panniculectomy     Family History  Problem Relation Age of Onset  . Cancer Mother    History  Substance Use Topics  . Smoking status: Former Smoker    Quit date: 01/02/1993  . Smokeless tobacco: Not on file     Comment: quit 2012  . Alcohol Use: No     Comment: occasional    Review of Systems  Respiratory: Positive for shortness of breath.   Cardiovascular: Positive for palpitations.  Skin: Positive for wound.  All other systems reviewed and are negative.     Allergies  Strawberry; Adhesive ; Clindamycin; Tetracyclines & related; and Vancomycin  Home Medications   Prior to Admission medications   Medication Sig Start Date End Date Taking?  Authorizing Provider  aspirin EC 325 MG tablet Take 325 mg by mouth daily.    Historical Provider, MD  cephALEXin (KEFLEX) 500 MG capsule Take 1 capsule (500 mg total) by mouth 4 (four) times daily. 02/09/14   Daleen Bo, MD  clotrimazole (LOTRIMIN) 1 % cream Apply to affected area 2 times daily Patient taking differently: Apply 1 application topically 2 (two) times daily. Apply to affected area 2 times daily 02/07/14   [facility], NP  diltiazem (CARDIZEM CD) 180 MG 24 hr capsule TAKE 2 CAPSULES DAILY. 04/09/14   Pixie Casino, MD  diltiazem (CARDIZEM LA) 360 MG 24 hr tablet Take 1 tablet (360 mg total) by mouth daily. 08/20/13   Pixie Casino, MD  HYDROcodone-acetaminophen (NORCO) 10-325 MG per tablet Take 1 tablet by mouth every 6 (six) hours as needed for moderate pain. 09/04/13   Samuella Cota, MD  oxyCODONE-acetaminophen (PERCOCET) 5-325 MG per tablet Take 1 tablet by mouth every 4 (four) hours as needed for severe pain. 02/09/14   Daleen Bo, MD  sulfamethoxazole-trimethoprim (BACTRIM DS) 800-160 MG per tablet Take 1 tablet by mouth every 12 (twelve) hours. 02/09/14   Daleen Bo, MD  warfarin (COUMADIN) 5 MG tablet TAKE 1&1/2 TO 2 TABLETS BY MOUTH DAILY AS DIRECTED. 04/09/14   Pixie Casino, MD   BP 138/79 mmHg  Pulse 54  Temp(Src) 97.7 F (36.5 C) (Oral)  Ht 5' 11"  (1.803 m)  Wt 500 lb (226.799 kg)  BMI 69.77 kg/m2  SpO2 91% Physical Exam  Constitutional: He is oriented to person, place, and time. He appears well-developed and well-nourished.  Non-toxic appearance.  HENT:  Head: Normocephalic.  Right Ear: Tympanic membrane and external ear normal.  Left Ear: Tympanic membrane and external ear normal.  Eyes: EOM and lids are normal. Pupils are equal, round, and reactive to light.  Neck: Normal range of motion. Neck supple. Carotid bruit is not present.  Cardiovascular: Normal rate, normal heart sounds, intact distal pulses and normal pulses.  An irregularly irregular rhythm  present.  Pulmonary/Chest: Breath sounds normal. Tachypnea noted. No respiratory distress.  Abdominal: Soft. Bowel sounds are normal. There is no tenderness. There is no guarding.  Musculoskeletal: Normal range of motion.  There is increased redness at the base of the left ring finger. With tenderness to palpation, and tenderness with flexion of the left fourth MP joint. There is some swelling and puffiness of the dorsum of the left hand. There is no red streaks going up the arm at this time. The left ring finger and the dorsum of the hand are warm but not hot there is no drainage from the ring finger.  Lymphadenopathy:       Head (right side): No submandibular adenopathy present.       Head (left side): No submandibular adenopathy present.    He has no cervical adenopathy.  Neurological: He is alert and oriented to person, place, and time. He has normal strength. No cranial nerve deficit or sensory deficit.  Skin: Skin is warm and dry.  Psychiatric: He has a normal mood and affect. His speech is normal.  Nursing note and vitals reviewed.   ED Course  Procedures  INCISION AND DRAINAGE LEFT RING FINGER Patient identified by arm band. Permission for the procedure given by the patient. Procedural time out taken before incision and drainage of the left ring finger.  The procedure was discussed and described with the patient in terms which he understood, and verbal permission given to proceed. Discussed with the patient that with him being on Coumadin that he may experience a little more bleeding right after the procedure, and he acknowledges understanding of the need for incision and drainage.  The left ring finger was painted with Betadine. The patient was draped in the usual sterile fashion. The abscess area was infiltrated with 1% plain lidocaine. After adequate anesthetic, a small incision was made with an 11 blade scalpel. A small-to-moderate amount of purulent drainage was removed. The area  was irrigated. A dressing was applied to the area. The patient tolerated the incision and drainage without problem. A culture was sent to the lab.  Labs Review Labs Reviewed - No data to display  Imaging Review No results found.   EKG Interpretation None      MDM  Vital signs within normal limits. The patient has what appears to be an abscess involving the ring finger of the left hand. The patient was given Rocephin intramuscularly here in the emergency department. He will be treated with Augmentin and warm soaks to the hand. A prescription for Percocet one every 6 hours his been given to the patient for pain. The patient is to follow-up with his primary physician for additional evaluation and management.    Final diagnoses:  None    **I have reviewed nursing notes, vital signs, and all appropriate lab  and imaging results for this patient.Lily Kocher, PA-C 04/28/14 [address] PA-C 04/28/14 1218  Elnora Morrison, MD 04/28/14 [phone]

## 2014-04-28 NOTE — Discharge Instructions (Signed)
Please soak your hand in a basin of warm Epsom salt water 2 times daily. Please use Augmentin 2 times daily with food. Use Tylenol for mild pain, use Percocet for more severe pain. Percocet may cause drowsiness, and/or constipation. Please use this medication with caution. Abscess An abscess (boil or furuncle) is an infected area on or under the skin. This area is filled with yellowish-white fluid (pus) and other material (debris). HOME CARE   Only take medicines as told by your doctor.  If you were given antibiotic medicine, take it as directed. Finish the medicine even if you start to feel better.  If gauze is used, follow your doctor's directions for changing the gauze.  To avoid spreading the infection:  Keep your abscess covered with a bandage.  Wash your hands well.  Do not share personal care items, towels, or whirlpools with others.  Avoid skin contact with others.  Keep your skin and clothes clean around the abscess.  Keep all doctor visits as told. GET HELP RIGHT AWAY IF:   You have more pain, puffiness (swelling), or redness in the wound site.  You have more fluid or blood coming from the wound site.  You have muscle aches, chills, or you feel sick.  You have a fever. MAKE SURE YOU:   Understand these instructions.  Will watch your condition.  Will get help right away if you are not doing well or get worse. Document Released: 06/07/2007 Document Revised: 06/20/2011 Document Reviewed: 03/03/2011 [facility] Patient Information 2015 [place], [place]. This information is not intended to replace advice given to you by your health care provider. Make sure you discuss any questions you have with your health care provider.

## 2014-04-28 NOTE — ED Notes (Signed)
Redness and swelling to left hand .

## 2014-05-01 LAB — CULTURE, ROUTINE-ABSCESS

## 2014-05-03 DEATH — deceased

## 2014-05-05 ENCOUNTER — Telehealth (HOSPITAL_BASED_OUTPATIENT_CLINIC_OR_DEPARTMENT_OTHER): Payer: Self-pay | Admitting: Emergency Medicine

## 2014-05-06 ENCOUNTER — Telehealth (HOSPITAL_BASED_OUTPATIENT_CLINIC_OR_DEPARTMENT_OTHER): Payer: Self-pay | Admitting: Emergency Medicine

## 2014-05-06 NOTE — Progress Notes (Signed)
ED Antimicrobial Stewardship Positive Culture Follow Up   [name] is an 64 y.o. male [facility] with a chief complaint of  Chief Complaint  Patient presents with  . Abscess    Recent Results (from the past 720 hour(s))  Culture, routine-abscess     Status: None   Collection Time: 04/28/14 12:03 PM  Result Value Ref Range Status   Specimen Description ABSCESS FINGER LEFT HAND  Final   Special Requests NONE  Final   Gram Stain   Final    FEW WBC PRESENT, PREDOMINANTLY PMN NO SQUAMOUS EPITHELIAL CELLS SEEN FEW GRAM POSITIVE COCCI IN PAIRS [facility]    Culture   Final    ABUNDANT METHICILLIN RESISTANT STAPHYLOCOCCUS AUREUS Note: RIFAMPIN AND GENTAMICIN SHOULD NOT BE USED AS SINGLE DRUGS FOR TREATMENT OF STAPH INFECTIONS. This organism DOES NOT demonstrate inducible Clindamycin resistance in vitro. CRITICAL RESULT CALLED TO, READ BACK BY AND VERIFIED WITH: Tania AdeSHANNON G.  11:31AM 05/01/14 HAJAM Performed at [facility]    Report Status 05/01/2014 FINAL  Final   Organism ID, Bacteria METHICILLIN RESISTANT STAPHYLOCOCCUS AUREUS  Final      Susceptibility   Methicillin resistant staphylococcus aureus - MIC*    CLINDAMYCIN <=0.25 SENSITIVE Sensitive     ERYTHROMYCIN >=8 RESISTANT Resistant     GENTAMICIN <=0.5 SENSITIVE Sensitive     LEVOFLOXACIN >=8 RESISTANT Resistant     OXACILLIN >=4 RESISTANT Resistant     PENICILLIN >=0.5 RESISTANT Resistant     RIFAMPIN <=0.5 SENSITIVE Sensitive     TRIMETH/SULFA <=10 SENSITIVE Sensitive     VANCOMYCIN 2 SENSITIVE Sensitive     TETRACYCLINE <=1 SENSITIVE Sensitive     * ABUNDANT METHICILLIN RESISTANT STAPHYLOCOCCUS AUREUS    [x]  Treated with augmentin, organism resistant to prescribed antimicrobial []  Patient discharged originally without antimicrobial agent and treatment is now indicated  New antibiotic prescription: Zyvox 600mg  1 tablet po BID x 7 days  ED Provider:  Oswaldo ConroyVictoria Creech, PA-C   Mickeal SkinnerFrens, Marianny Goris John 05/06/2014, 8:52 AM Infectious Diseases Pharmacist Phone# [phone]

## 2014-05-07 ENCOUNTER — Telehealth: Payer: Self-pay | Admitting: Emergency Medicine

## 2014-05-07 ENCOUNTER — Ambulatory Visit: Payer: Self-pay | Admitting: *Deleted

## 2014-05-07 DIAGNOSIS — I482 Chronic atrial fibrillation, unspecified: Secondary | ICD-10-CM

## 2014-05-07 DIAGNOSIS — Z5181 Encounter for therapeutic drug level monitoring: Secondary | ICD-10-CM

## 2015-04-20 IMAGING — US US EXTREM LOW VENOUS*L*
1 series · 13 of 23 positions shown · non-contrast
Comparison: None.

CLINICAL DATA: Left leg pain and swelling



[Series 1: us extrem low venous*left* · 0.09mm/px · 13 of 23 slices shown]
[im 1/23]
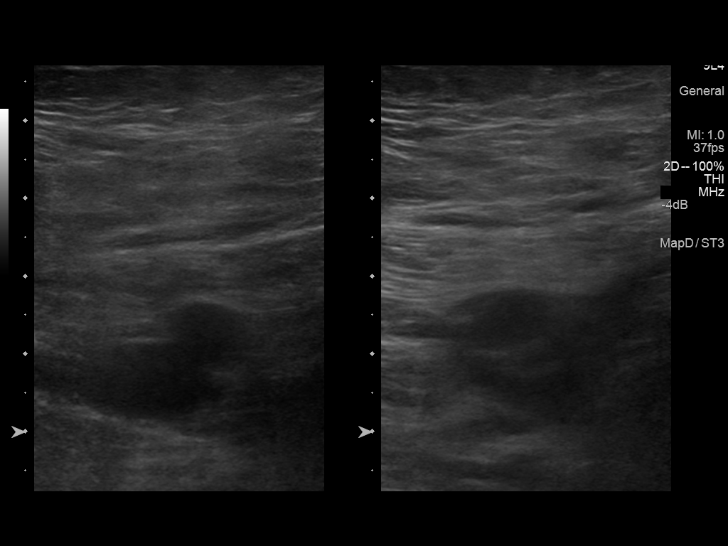
[im 3/23]
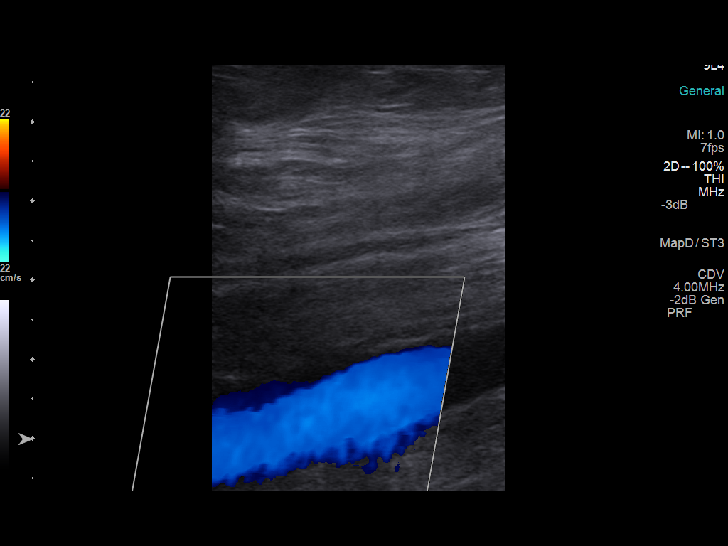
[im 5/23]
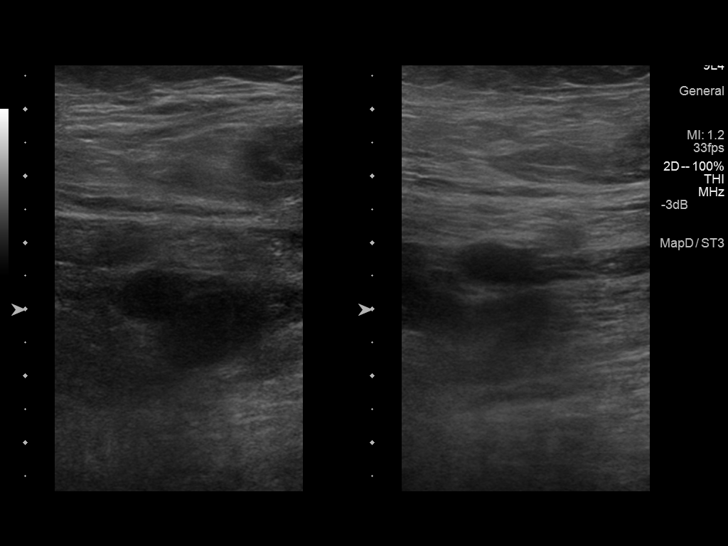
[im 7/23]
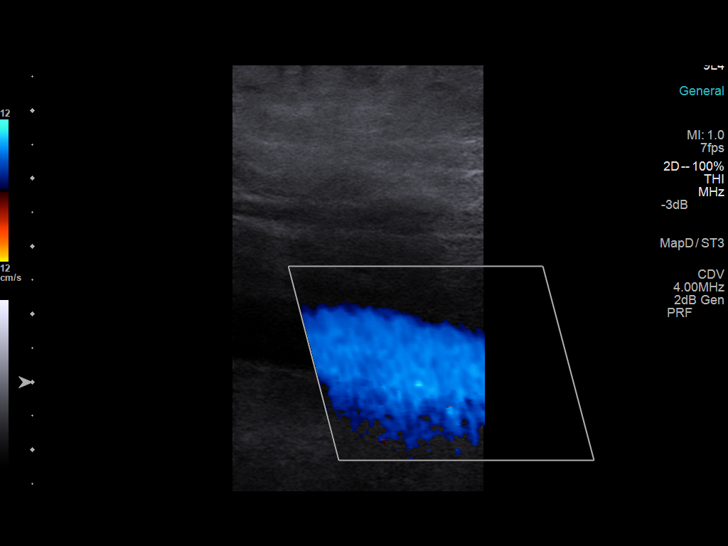
[im 8/23]
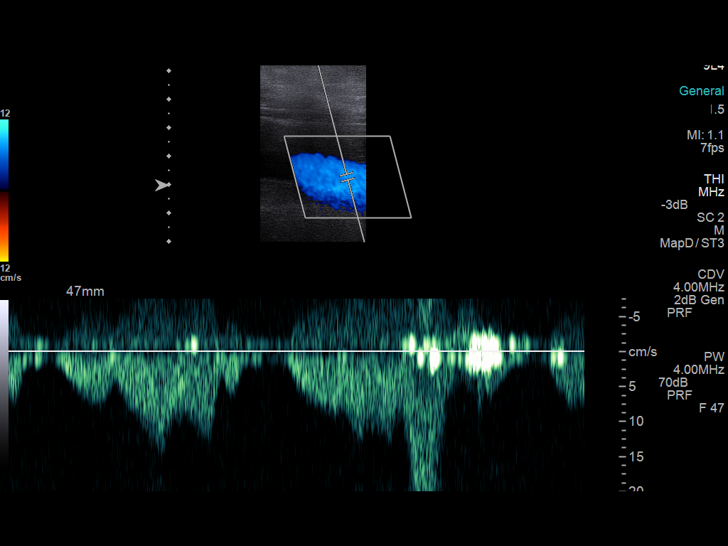
[im 10/23]
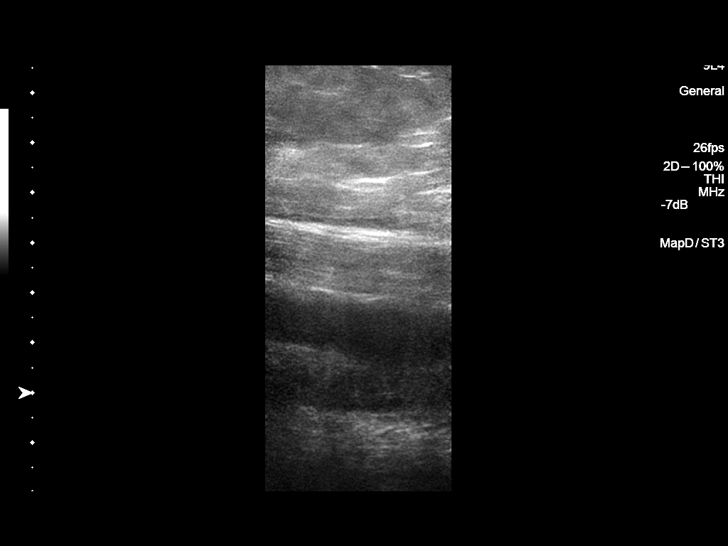
[im 12/23]
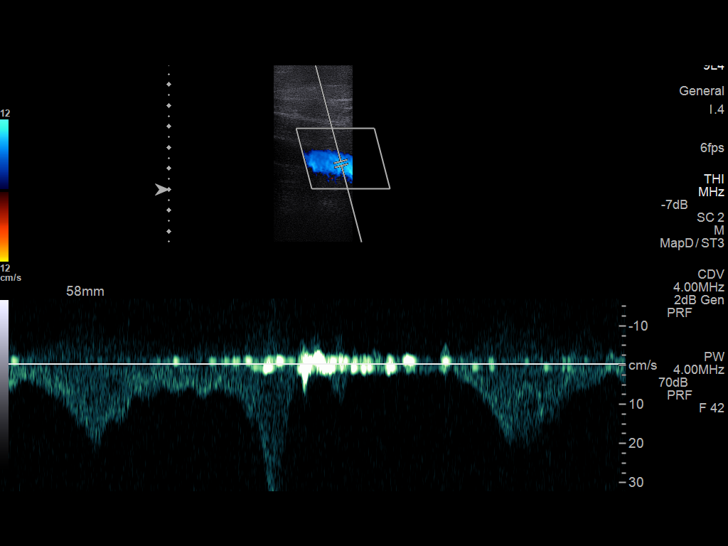
[im 14/23]
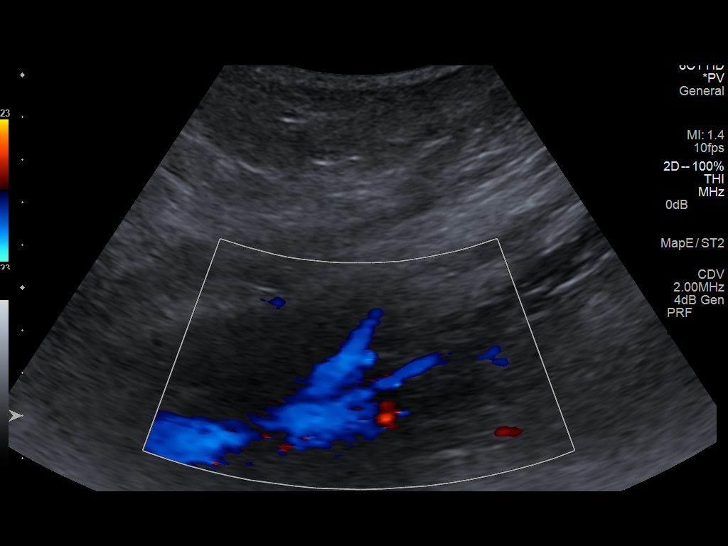
[im 16/23]
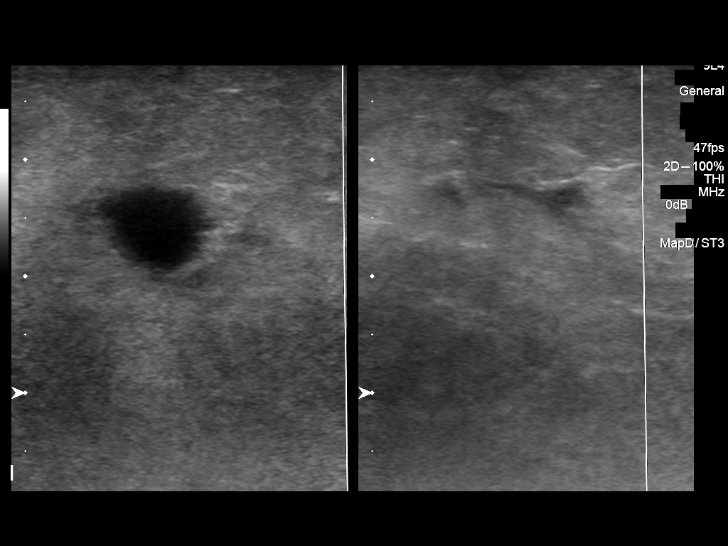
[im 17/23]
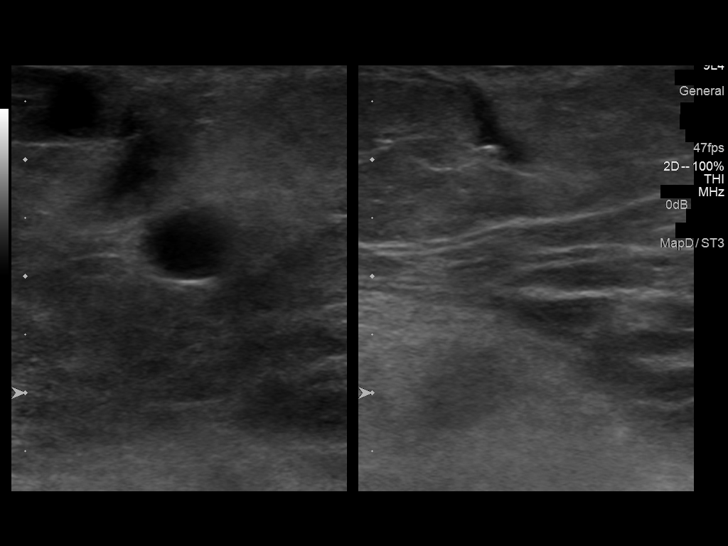
[im 19/23]
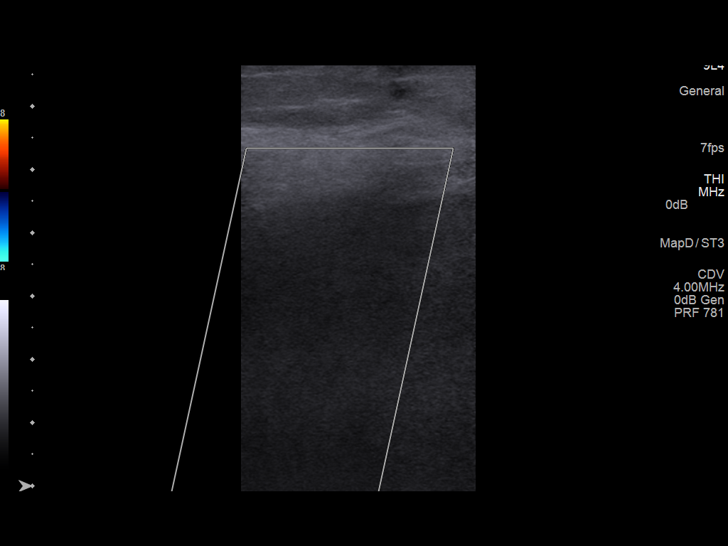
[im 21/23]
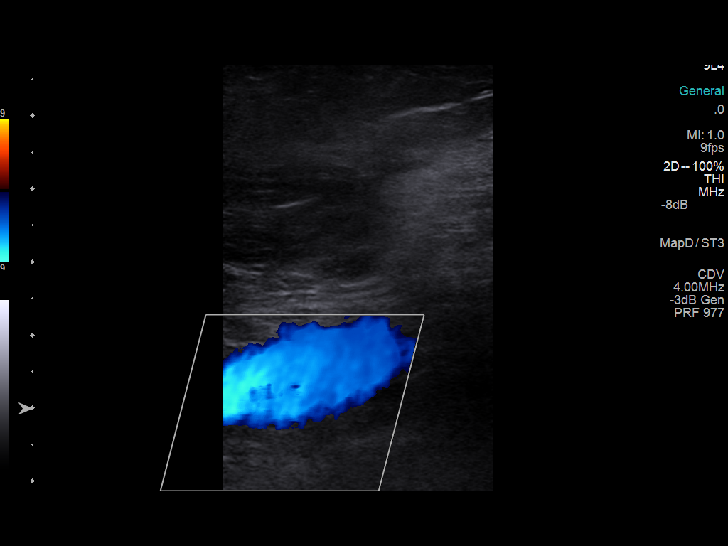
[im 23/23]
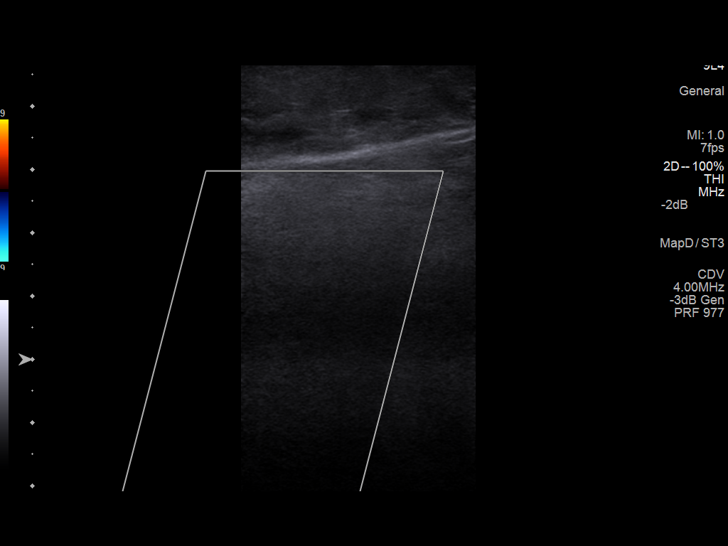

[13 of 23 positions shown; findings below may reference images not displayed]

FINDINGS: Common Femoral Vein: No evidence of thrombus. Normal
compressibility, respiratory phasicity and response to augmentation.

Saphenofemoral Junction: No evidence of thrombus. Normal
compressibility and flow on color Doppler imaging.

Profunda Femoral Vein: Not well visualized due to patient body
habitus.

Femoral Vein: No evidence of thrombus. Normal compressibility,
respiratory phasicity and response to augmentation.

Popliteal Vein: No evidence of thrombus. Normal compressibility,
respiratory phasicity and response to augmentation.

Calf Veins: The posterior tibial vein is within normal limits. The
anterior tibial and peroneal veins are not well visualized due to
the patient's body habitus.

Superficial Great Saphenous Vein: No evidence of thrombus. Normal
compressibility and flow on color Doppler imaging.

Venous Reflux:  None.

Other Findings:  None.
IMPRESSION: Somewhat limited exam although no definitive venous thrombosis is
noted.
